# Patient Record
Sex: Male | Born: 1975 | Race: Black or African American | Hispanic: No | Marital: Single | Smoking: Current every day smoker
Health system: Southern US, Community
[De-identification: ages and names within clinical notes are randomized; demographics above are authoritative.]

---

## 2007-07-17 ENCOUNTER — Emergency Department: Payer: Self-pay | Admitting: Emergency Medicine

## 2012-09-29 ENCOUNTER — Emergency Department (HOSPITAL_BASED_OUTPATIENT_CLINIC_OR_DEPARTMENT_OTHER)
Admission: EM | Admit: 2012-09-29 | Discharge: 2012-09-29 | Disposition: A | Discharge status: Home / Self Care | Payer: Self-pay | Attending: Emergency Medicine | Admitting: Emergency Medicine

## 2012-09-29 ENCOUNTER — Encounter (HOSPITAL_BASED_OUTPATIENT_CLINIC_OR_DEPARTMENT_OTHER): Payer: Self-pay

## 2012-09-29 DIAGNOSIS — F17200 Nicotine dependence, unspecified, uncomplicated: Secondary | ICD-10-CM | POA: Insufficient documentation

## 2012-09-29 DIAGNOSIS — R599 Enlarged lymph nodes, unspecified: Secondary | ICD-10-CM | POA: Insufficient documentation

## 2012-09-29 MED ORDER — AZITHROMYCIN 250 MG PO TABS
2000.0000 mg | ORAL_TABLET | Freq: Once | ORAL | Status: AC
  Administered 2012-09-29: 2000 mg via ORAL
  Filled 2012-09-29: qty 8

## 2012-09-29 NOTE — ED Notes (Signed)
Pt c/o swelling to left groin-first noticed 2 days ago during sex-denies pain except with deep palpation

## 2012-09-29 NOTE — ED Provider Notes (Addendum)
CSN: [id number]     Arrival date & time 09/29/12  1052 History     First MD Initiated Contact with Patient 09/29/12 1132     Chief Complaint  Patient presents with  . Groin Swelling   (Consider location/radiation/quality/duration/timing/severity/associated sxs/prior Treatment) HPI Comments: The patient presents to the ER with a painful lump in his left groin. Patient reports that it started 2 days ago. He first noticed it after having sex. He has not had any penile discharge, testicular pain or swelling. Denies any lesions on his penis or groin area.   History reviewed. No pertinent past medical history. History reviewed. No pertinent past surgical history. No family history on file. History  Substance Use Topics  . Smoking status: Current Every Day Smoker  . Smokeless tobacco: Not on file  . Alcohol Use: No    Review of Systems  Constitutional: Negative for fever.  Genitourinary: Negative for penile swelling, scrotal swelling and testicular pain.    Allergies  Review of patient's allergies indicates no known allergies.  Home Medications  No current outpatient prescriptions on file. BP 122/77  Pulse 88  Temp(Src) 98.1 F (36.7 C) (Oral)  Resp 16  Ht 6\' 4"  (1.93 m)  Wt 217 lb (98.431 kg)  BMI 26.43 kg/m2  SpO2 99% Physical Exam  Constitutional: He is oriented to person, place, and time. He appears well-developed and well-nourished. No distress.  HENT:  Head: Normocephalic and atraumatic.  Right Ear: Hearing normal.  Left Ear: Hearing normal.  Nose: Nose normal.  Mouth/Throat: Oropharynx is clear and moist and mucous membranes are normal.  Eyes: Conjunctivae and EOM are normal. Pupils are equal, round, and reactive to light.  Neck: Normal range of motion. Neck supple.  Cardiovascular: Regular rhythm, S1 normal and S2 normal.  Exam reveals no gallop and no friction rub.   No murmur heard. Pulmonary/Chest: Effort normal and breath sounds normal. No respiratory  distress. He exhibits no tenderness.  Abdominal: Soft. Normal appearance and bowel sounds are normal. There is no hepatosplenomegaly. There is no tenderness. There is no rebound, no guarding, no tenderness at McBurney's point and negative Murphy's sign. No hernia. Hernia confirmed negative in the right inguinal area and confirmed negative in the left inguinal area.  Genitourinary: Testes normal and penis normal. Right testis shows no mass, no swelling and no tenderness. Left testis shows no mass, no swelling and no tenderness. Circumcised.  Musculoskeletal: Normal range of motion.  Lymphadenopathy:       Left: Inguinal (Less than 1 cm slightly tender inguinal node present) adenopathy present.  Neurological: He is alert and oriented to person, place, and time. He has normal strength. No cranial nerve deficit or sensory deficit. Coordination normal. GCS eye subscore is 4. GCS verbal subscore is 5. GCS motor subscore is 6.  Skin: Skin is warm, dry and intact. No rash noted. No cyanosis.  Psychiatric: He has a normal mood and affect. His speech is normal and behavior is normal. Thought content normal.    ED Course   Procedures (including critical care time)  Labs Reviewed - No data to display No results found.  Diagnosis: Lymphadenopathy  MDM  Patient has identified a small lump in his left groin which is clearly a lymph node. It does not seem to be enlarged or inflamed. He has minimal tenderness. There is no associated infection. Genital exam is normal and there is no urethral discharge. Likely does not need treatment, but will empirically treat with Zithromax 2 g  by mouth x1. Follow up as needed.  Gilda Crease, MD 09/29/12 1140  Gilda Crease, MD 09/29/12 1141

## 2012-12-06 ENCOUNTER — Emergency Department (HOSPITAL_BASED_OUTPATIENT_CLINIC_OR_DEPARTMENT_OTHER)
Admission: EM | Admit: 2012-12-06 | Discharge: 2012-12-06 | Discharge status: Against Medical Advice | Payer: Self-pay | Attending: Emergency Medicine | Admitting: Emergency Medicine

## 2012-12-06 ENCOUNTER — Emergency Department (HOSPITAL_BASED_OUTPATIENT_CLINIC_OR_DEPARTMENT_OTHER): Payer: Self-pay

## 2012-12-06 ENCOUNTER — Encounter (HOSPITAL_BASED_OUTPATIENT_CLINIC_OR_DEPARTMENT_OTHER): Payer: Self-pay | Admitting: Emergency Medicine

## 2012-12-06 DIAGNOSIS — M549 Dorsalgia, unspecified: Secondary | ICD-10-CM | POA: Insufficient documentation

## 2012-12-06 DIAGNOSIS — F17200 Nicotine dependence, unspecified, uncomplicated: Secondary | ICD-10-CM | POA: Insufficient documentation

## 2012-12-06 DIAGNOSIS — Z8619 Personal history of other infectious and parasitic diseases: Secondary | ICD-10-CM | POA: Insufficient documentation

## 2012-12-06 DIAGNOSIS — R300 Dysuria: Secondary | ICD-10-CM | POA: Insufficient documentation

## 2012-12-06 DIAGNOSIS — R109 Unspecified abdominal pain: Secondary | ICD-10-CM | POA: Insufficient documentation

## 2012-12-06 LAB — BASIC METABOLIC PANEL
BUN: 10 mg/dL (ref 6–23)
CO2: 30 mEq/L (ref 19–32)
Chloride: 104 mEq/L (ref 96–112)
Creatinine, Ser: 1.2 mg/dL (ref 0.50–1.35)

## 2012-12-06 LAB — CBC WITH DIFFERENTIAL/PLATELET
Basophils Relative: 0 % (ref 0–1)
HCT: 44.8 % (ref 39.0–52.0)
Hemoglobin: 14.6 g/dL (ref 13.0–17.0)
Lymphocytes Relative: 31 % (ref 12–46)
MCHC: 32.6 g/dL (ref 30.0–36.0)
Monocytes Absolute: 0.7 10*3/uL (ref 0.1–1.0)
Monocytes Relative: 7 % (ref 3–12)
Neutro Abs: 6.3 10*3/uL (ref 1.7–7.7)

## 2012-12-06 LAB — URINALYSIS, ROUTINE W REFLEX MICROSCOPIC
Glucose, UA: NEGATIVE mg/dL
Ketones, ur: NEGATIVE mg/dL
Leukocytes, UA: NEGATIVE
Protein, ur: 100 mg/dL — AB
Urobilinogen, UA: 1 mg/dL (ref 0.0–1.0)

## 2012-12-06 LAB — URINE MICROSCOPIC-ADD ON

## 2012-12-06 MED ORDER — OXYCODONE-ACETAMINOPHEN 5-325 MG PO TABS: 1.0000 | ORAL_TABLET | ORAL | Status: DC | PRN

## 2012-12-06 MED ORDER — CLINDAMYCIN HCL 300 MG PO CAPS: 300.0000 mg | ORAL_CAPSULE | Freq: Three times a day (TID) | ORAL | Status: DC

## 2012-12-06 NOTE — ED Notes (Signed)
Back pain x 2 days. Penile discharge.

## 2012-12-06 NOTE — ED Provider Notes (Signed)
CSN: [id number]     Arrival date & time 12/06/12  1231 History   First MD Initiated Contact with Patient 12/06/12 1327     Chief Complaint  Patient presents with  . Back Pain   (Consider location/radiation/quality/duration/timing/severity/associated sxs/prior Treatment) HPI Comments: 37 year old male presents with intermittent back pain for one week. He is also had dysuria during this time. He states that it does not quite burned but more stings area and he states he's had gonorrhea in the past and this is very different and milder. He has not noticed any urinary discharge. Has not had any penile or testicular pain. He has a family history of kidney stones he has never had any.   History reviewed. No pertinent past medical history. History reviewed. No pertinent past surgical history. No family history on file. History  Substance Use Topics  . Smoking status: Current Every Day Smoker -- 0.50 packs/day    Types: Cigarettes  . Smokeless tobacco: Not on file  . Alcohol Use: No    Review of Systems  Constitutional: Negative for fever and chills.  Gastrointestinal: Positive for abdominal pain (Intermittently). Negative for nausea, vomiting and diarrhea.  Genitourinary: Positive for dysuria. Negative for frequency, hematuria, flank pain, discharge, penile swelling, scrotal swelling, penile pain and testicular pain.  Musculoskeletal: Positive for back pain.  All other systems reviewed and are negative.    Allergies  Review of patient's allergies indicates no known allergies.  Home Medications   Current Outpatient Rx  Name  Route  Sig  Dispense  Refill  . clindamycin (CLEOCIN) 300 MG capsule   Oral   Take 1 capsule (300 mg total) by mouth 3 (three) times daily. X 10 days   30 capsule   0   . oxyCODONE-acetaminophen (PERCOCET) 5-325 MG per tablet   Oral   Take 1-2 tablets by mouth every 4 (four) hours as needed for pain.   20 tablet   0    BP 126/71  Pulse 84  Temp(Src)  98.5 F (36.9 C) (Oral)  Resp 16  Ht 6\' 5"  (1.956 m)  Wt 220 lb (99.791 kg)  BMI 26.08 kg/m2  SpO2 100% Physical Exam  Nursing note and vitals reviewed. Constitutional: He is oriented to person, place, and time. He appears well-developed and well-nourished. No distress.  HENT:  Head: Normocephalic and atraumatic.  Right Ear: External ear normal.  Left Ear: External ear normal.  Nose: Nose normal.  Eyes: Right eye exhibits no discharge. Left eye exhibits no discharge.  Neck: Neck supple.  Cardiovascular: Normal rate, regular rhythm, normal heart sounds and intact distal pulses.   Pulmonary/Chest: Effort normal and breath sounds normal.  Abdominal: Soft. There is no tenderness. There is no CVA tenderness.  Genitourinary: Penis normal. Right testis shows no tenderness. Left testis shows no tenderness.  Musculoskeletal:       Thoracic back: He exhibits no tenderness.       Lumbar back: He exhibits no tenderness.  Neurological: He is alert and oriented to person, place, and time. He has normal reflexes.  5/5 strength in lower extremities  Skin: Skin is warm and dry.    ED Course  Procedures (including critical care time) Labs Review Labs Reviewed  URINALYSIS, ROUTINE W REFLEX MICROSCOPIC - Abnormal; Notable for the following:    Hgb urine dipstick LARGE (*)    Protein, ur 100 (*)    All other components within normal limits  URINE MICROSCOPIC-ADD ON - Abnormal; Notable for the following:  Bacteria, UA FEW (*)    All other components within normal limits  BASIC METABOLIC PANEL - Abnormal; Notable for the following:    GFR calc non Af Amer 76 (*)    GFR calc Af Amer 88 (*)    All other components within normal limits  URINE CULTURE  CBC WITH DIFFERENTIAL   Imaging Review No results found.    MDM   1. Dysuria   2. Back pain    Patient with atypical back pain. Has normal neuro exam, no midline tenderness or systemic signs to suggest spinal cord/disc emergency. Is  well appearing. Has dysuria w/ hematuria, planned to eval for kidney stone but patient eloped prior to CT scan. He left his IV in the trash prior to leaving per the nurse.     Audree Camel, MD 12/06/12 [phone]

## 2012-12-06 NOTE — ED Notes (Signed)
Pt not found in room, CT, or any where else throughout departmetn, including lobby. Pt's gown on bed, IV in trash. MD made aware.

## 2012-12-08 LAB — URINE CULTURE
Colony Count: NO GROWTH
Culture: NO GROWTH

## 2013-06-30 ENCOUNTER — Encounter: Payer: Self-pay | Admitting: Emergency Medicine

## 2013-06-30 ENCOUNTER — Emergency Department
Admission: EM | Admit: 2013-06-30 | Discharge: 2013-06-30 | Disposition: A | Discharge status: Home / Self Care | Payer: Self-pay | Source: Home / Self Care | Attending: Emergency Medicine | Admitting: Emergency Medicine

## 2013-06-30 DIAGNOSIS — Z202 Contact with and (suspected) exposure to infections with a predominantly sexual mode of transmission: Secondary | ICD-10-CM

## 2013-06-30 DIAGNOSIS — N342 Other urethritis: Secondary | ICD-10-CM

## 2013-06-30 LAB — POCT URINALYSIS DIP (MANUAL ENTRY)
Bilirubin, UA: NEGATIVE
Blood, UA: NEGATIVE
Glucose, UA: NEGATIVE
Ketones, POC UA: NEGATIVE
Leukocytes, UA: NEGATIVE
Nitrite, UA: NEGATIVE
Spec Grav, UA: 1.025 (ref 1.005–1.03)
Urobilinogen, UA: 1 (ref 0–1)
pH, UA: 7 (ref 5–8)

## 2013-06-30 MED ORDER — DOXYCYCLINE HYCLATE 100 MG PO CAPS
100.0000 mg | ORAL_CAPSULE | Freq: Two times a day (BID) | ORAL | Status: DC
Start: 2013-06-30 — End: 2013-10-08

## 2013-06-30 NOTE — ED Notes (Signed)
Pt reports that he had unprotected sex 1 wk ago and since then his genital area "feels funny" and tingling. Denies fever, d/c or swelling.

## 2013-07-01 LAB — GC/CHLAMYDIA PROBE AMP, URINE
Chlamydia, Swab/Urine, PCR: NEGATIVE
GC Probe Amp, Urine: NEGATIVE

## 2013-07-01 LAB — RPR

## 2013-07-01 LAB — HIV ANTIBODY (ROUTINE TESTING W REFLEX): HIV 1&2 Ab, 4th Generation: NONREACTIVE

## 2013-07-02 NOTE — ED Provider Notes (Addendum)
CSN: [id number]     Arrival date & time 06/30/13  1154 History   First MD Initiated Contact with Patient 06/30/13 1247     Chief Complaint  Patient presents with  . Exposure to STD  . Groin Pain   (Consider location/radiation/quality/duration/timing/severity/associated sxs/prior Treatment) HPI Had unprotected vaginal intercourse with a woman seven days ago. Complains of tingling, and "feeling funny" in the urethra for past several days. No burning dysuria or hematuria or pyuria or discharge or swelling or any other GU symptoms. No fever or chills or general symptoms and he otherwise feels well. No rash or sores or lesions History reviewed. No pertinent past medical history. History reviewed. No pertinent past surgical history. History reviewed. No pertinent family history. History  Substance Use Topics  . Smoking status: Current Every Day Smoker -- 0.50 packs/day    Types: Cigarettes  . Smokeless tobacco: Not on file  . Alcohol Use: No    Review of Systems  All other systems reviewed and are negative.   Allergies  Review of patient's allergies indicates no known allergies.  Home Medications   Prior to Admission medications   Medication Sig Start Date End Date Taking? Authorizing Provider  doxycycline (VIBRAMYCIN) 100 MG capsule Take 1 capsule (100 mg total) by mouth 2 (two) times daily. 06/30/13   Lajean Manesavid Massey, MD   BP 130/81  Pulse 79  Temp(Src) 98.2 F (36.8 C) (Oral)  Resp 16  Ht 6\' 5"  (1.956 m)  Wt 204 lb (92.534 kg)  BMI 24.19 kg/m2  SpO2 98% Physical Exam  Nursing note and vitals reviewed. Constitutional: He is oriented to person, place, and time. He appears well-developed and well-nourished. No distress.  HENT:  Head: Normocephalic and atraumatic.  Eyes: Conjunctivae and EOM are normal. Pupils are equal, round, and reactive to light. No scleral icterus.  Neck: Normal range of motion.  Cardiovascular: Normal rate.   Pulmonary/Chest: Effort normal.   Abdominal: He exhibits no distension.  Genitourinary: Testes normal and penis normal. No penile erythema. No discharge found.  No lesions seen. No masses  Musculoskeletal: Normal range of motion.  Lymphadenopathy:       Right: No inguinal adenopathy present.       Left: No inguinal adenopathy present.  Neurological: He is alert and oriented to person, place, and time.  Skin: Skin is warm.  Psychiatric: He has a normal mood and affect.    ED Course  Procedures (including critical care time) Labs Review Labs Reviewed  GC/CHLAMYDIA PROBE AMP, URINE   Narrative:    Performed at:  [facility]                [address], [address]                [place], [place] [address]  HIV ANTIBODY (ROUTINE TESTING)   Narrative:    Performed at:  [facility]                [address], [address]                [place], [place] [address]  RPR   Narrative:    Performed at:  [facility]                [address], [address]                [place], [place] [address]  GC/CHLAMYDIA PROBE AMP, GENITAL  POCT URINALYSIS DIP (MANUAL ENTRY)  Imaging Review No results found.  Urinalysis today within normal limits MDM   1. Exposure to STD   2. Urethritis    Testing and treatment options discussed. He agrees with the following plans: Test for chlamydia, GC, HIV and RPR. Empiric coverage for chlamydia with doxycycline 100 BID times 10 days. Follow-up with your primary care doctor in 5-7 days if not improving, or sooner if symptoms become worse. Precautions discussed. Red flags discussed. Questions invited and answered.---discussed preventative measures and safe sex Patient voiced understanding and agreement.     Lajean Manesavid Massey, MD 07/02/13 1147  Lajean Manesavid Massey, MD 07/02/13 1148  Lajean Manesavid Massey, MD 07/02/13 1149

## 2013-07-03 ENCOUNTER — Telehealth: Payer: Self-pay

## 2013-07-03 ENCOUNTER — Telehealth: Payer: Self-pay | Admitting: *Deleted

## 2013-07-03 NOTE — ED Notes (Signed)
Called patient left message for patient to return call 

## 2013-07-04 ENCOUNTER — Telehealth: Payer: Self-pay | Admitting: *Deleted

## 2013-10-08 ENCOUNTER — Emergency Department (HOSPITAL_BASED_OUTPATIENT_CLINIC_OR_DEPARTMENT_OTHER)
Admission: EM | Admit: 2013-10-08 | Discharge: 2013-10-08 | Disposition: A | Discharge status: Home / Self Care | Payer: Self-pay | Attending: Emergency Medicine | Admitting: Emergency Medicine

## 2013-10-08 ENCOUNTER — Encounter (HOSPITAL_BASED_OUTPATIENT_CLINIC_OR_DEPARTMENT_OTHER): Payer: Self-pay | Admitting: Emergency Medicine

## 2013-10-08 DIAGNOSIS — F17200 Nicotine dependence, unspecified, uncomplicated: Secondary | ICD-10-CM | POA: Insufficient documentation

## 2013-10-08 DIAGNOSIS — Z202 Contact with and (suspected) exposure to infections with a predominantly sexual mode of transmission: Secondary | ICD-10-CM | POA: Insufficient documentation

## 2013-10-08 DIAGNOSIS — R350 Frequency of micturition: Secondary | ICD-10-CM | POA: Insufficient documentation

## 2013-10-08 MED ORDER — METRONIDAZOLE 500 MG PO TABS
2000.0000 mg | ORAL_TABLET | Freq: Once | ORAL | Status: AC
  Administered 2013-10-08: 2000 mg via ORAL
  Filled 2013-10-08: qty 4

## 2013-10-08 NOTE — Discharge Instructions (Signed)
Return to the ED with any concerns including, vomitng and not able to keep down liquids, abdominal pain, difficulty breathing, decreased level of alertness/lethargy, or any other alarming symptoms

## 2013-10-08 NOTE — ED Provider Notes (Signed)
CSN: [id number]     Arrival date & time 10/08/13  1015 History   First MD Initiated Contact with Patient 10/08/13 1030     Chief Complaint  Patient presents with  . Urinary Frequency     (Consider location/radiation/quality/duration/timing/severity/associated sxs/prior Treatment) HPI Pt presenting with c/o being informed that his sexual partner was diagnosed with trich.  Other tests were negative.  Pt denies any symptoms.  He mentioned to triage nurse that he had urinary frequency, but during my eval he notes that since receiving this information he has been thinking about it nonstop and feels this is why he is urinating frequently.  No penile discharge, no testicular pain.  No fever/chills, no vomiting or abdominal pain.  Discharged with strict return precautions.  Pt agreeable with plan.  History reviewed. No pertinent past medical history. History reviewed. No pertinent past surgical history. No family history on file. History  Substance Use Topics  . Smoking status: Current Every Day Smoker -- 0.50 packs/day    Types: Cigarettes  . Smokeless tobacco: Not on file  . Alcohol Use: No    Review of Systems ROS reviewed and all otherwise negative except for mentioned in HPI    Allergies  Review of patient's allergies indicates no known allergies.  Home Medications   Prior to Admission medications   Not on File   BP 129/86  Pulse 87  Temp(Src) 97.6 F (36.4 C) (Oral)  Resp 16  Ht 6\' 5"  (1.956 m)  Wt 205 lb (92.987 kg)  BMI 24.30 kg/m2  SpO2 100% Vitals reviewed Physical Exam Physical Examination: General appearance - alert, well appearing, and in no distress Mental status - alert, oriented to person, place, and time Eyes - no conjunctival injection, no scleral icterus Mouth - mucous membranes moist, pharynx normal without lesions Chest - clear to auscultation, no wheezes, rales or rhonchi, symmetric air entry Heart - normal rate, regular rhythm, normal S1, S2, no  murmurs, rubs, clicks or gallops Extremities - peripheral pulses normal, no pedal edema, no clubbing or cyanosis Skin - normal coloration and turgor, no rashes  ED Course  Procedures (including critical care time) Labs Review Labs Reviewed - No data to display  Imaging Review No results found.   EKG Interpretation None      MDM   Final diagnoses:  STD exposure    Pt presents for treatment as sexual partner was diagnosed with trich several days ago.  Pt treated with flagyl.  Discharged with strict return precautions.  Pt agreeable with plan.   Ethelda ChickMartha K Linker, MD 10/12/13 [phone]

## 2013-10-08 NOTE — ED Notes (Signed)
Patient here with urinary frequency for the past couple of days, reports that his partner informed him last week that he was diagnosed with STD. Denies discharge, denies any discomfort

## 2015-05-13 ENCOUNTER — Emergency Department (HOSPITAL_BASED_OUTPATIENT_CLINIC_OR_DEPARTMENT_OTHER)
Admission: EM | Admit: 2015-05-13 | Discharge: 2015-05-13 | Disposition: A | Discharge status: Home / Self Care | Payer: Self-pay | Attending: Emergency Medicine | Admitting: Emergency Medicine

## 2015-05-13 ENCOUNTER — Encounter (HOSPITAL_BASED_OUTPATIENT_CLINIC_OR_DEPARTMENT_OTHER): Payer: Self-pay | Admitting: Emergency Medicine

## 2015-05-13 DIAGNOSIS — H05222 Edema of left orbit: Secondary | ICD-10-CM | POA: Insufficient documentation

## 2015-05-13 DIAGNOSIS — B86 Scabies: Secondary | ICD-10-CM | POA: Insufficient documentation

## 2015-05-13 DIAGNOSIS — F17210 Nicotine dependence, cigarettes, uncomplicated: Secondary | ICD-10-CM | POA: Insufficient documentation

## 2015-05-13 DIAGNOSIS — H5789 Other specified disorders of eye and adnexa: Secondary | ICD-10-CM

## 2015-05-13 MED ORDER — PERMETHRIN 5 % EX CREA: TOPICAL_CREAM | CUTANEOUS | Status: DC

## 2015-05-13 MED ORDER — CEPHALEXIN 500 MG PO CAPS: 500.0000 mg | ORAL_CAPSULE | Freq: Two times a day (BID) | ORAL | Status: DC

## 2015-05-13 MED FILL — PERMETHRIN 5% CREAM: 5 | 7 days supply | Qty: 60 | Fill #0

## 2015-05-13 MED FILL — CEPHALEXIN 500 MG CAPSULE: 500 | 7 days supply | Qty: 14 | Fill #0

## 2015-05-13 NOTE — ED Provider Notes (Signed)
CSN: [id number]     Arrival date & time 05/13/15  [id number] History   First MD Initiated Contact with Patient 05/13/15 1018     Chief Complaint  Patient presents with  . Eye Problem     (Consider location/radiation/quality/duration/timing/severity/associated sxs/prior Treatment) HPI Patient with 2 days of left periorbital swelling and redness. Also notes to having "bug bites" in between his fingers. No fever or chills. States he stayed in a hotel Friday night prior to symptoms onset. No other bites noted. No visual change. No pain to the eye or discharge. No known trauma. Patient states he's been taking Benadryl with no relief of the swelling.  History reviewed. No pertinent past medical history. History reviewed. No pertinent past surgical history. History reviewed. No pertinent family history. Social History  Substance Use Topics  . Smoking status: Current Every Day Smoker -- 0.50 packs/day    Types: Cigarettes  . Smokeless tobacco: None  . Alcohol Use: No    Review of Systems  Constitutional: Negative for fever and chills.  Eyes: Positive for redness. Negative for photophobia, pain, discharge and visual disturbance.  Skin: Positive for color change and rash.  Neurological: Negative for headaches.  All other systems reviewed and are negative.     Allergies  Review of patient's allergies indicates no known allergies.  Home Medications   Prior to Admission medications   Medication Sig Start Date End Date Taking? Authorizing Provider  diphenhydrAMINE (BENADRYL) 25 mg capsule Take 25 mg by mouth every 6 (six) hours as needed.   Yes Historical Provider, MD  cephALEXin (KEFLEX) 500 MG capsule Take 1 capsule (500 mg total) by mouth 2 (two) times daily. 05/13/15   Loren Raceravid Terre Hanneman, MD  permethrin (ELIMITE) 5 % cream Apply to entire body 05/13/15   Loren Raceravid Melana Hingle, MD   BP 115/80 mmHg  Pulse 80  Temp(Src) 98.1 F (36.7 C) (Oral)  Resp 20  Ht 6\' 4"  (1.93 m)  Wt 210 lb (95.255 kg)   BMI 25.57 kg/m2  SpO2 100% Physical Exam  Constitutional: He is oriented to person, place, and time. He appears well-developed and well-nourished. No distress.  HENT:  Head: Normocephalic and atraumatic.  Mouth/Throat: Oropharynx is clear and moist. No oropharyngeal exudate.  Patient has left-sided periorbital swelling and erythema. There is no induration. Patient has full range of motion of the eye without pain. There is mild conjunctival injection. No discharge noted. No hyphema or pupillary irregularity. No foreign bodies  Eyes: EOM are normal. Pupils are equal, round, and reactive to light. Right eye exhibits no discharge. Left eye exhibits no discharge.  Neck: Normal range of motion. Neck supple.  Cardiovascular: Normal rate and regular rhythm.   Pulmonary/Chest: Effort normal and breath sounds normal.  Abdominal: Soft. Bowel sounds are normal.  Musculoskeletal: Normal range of motion. He exhibits no edema or tenderness.  Lymphadenopathy:    He has no cervical adenopathy.  Neurological: He is alert and oriented to person, place, and time.  Skin: Skin is warm and dry. Rash noted. No erythema.  Patient has multiple small erythematous papules located in the interdigital spaces of both hands. No tracking.  Psychiatric: He has a normal mood and affect. His behavior is normal.  Nursing note and vitals reviewed.   ED Course  Procedures (including critical care time) Labs Review Labs Reviewed - No data to display  Imaging Review No results found. I have personally reviewed and evaluated these images and lab results as part of my medical decision-making.  EKG Interpretation None      MDM   Final diagnoses:  Periorbital swelling  Scabies    Suspect possible bedbugs versus scabies. This does not explain left periorbital swelling. There is no obvious bites in the area. Concern for periorbital cellulitis versus allergic reaction. Patient does not have orbital cellulitis by exam.  Patient has been taking Benadryl with little relief. We'll try a short course of Keflex. Also give permethrin cream. Return precautions given.    Loren Racer, MD 05/13/15 1050

## 2015-05-13 NOTE — Discharge Instructions (Signed)
Scabies, Adult Scabies is a skin condition that happens when very small insects get under the skin (infestation). This causes a rash and severe itchiness. Scabies can spread from person to person (is contagious). If you get scabies, it is common for others in your household to get scabies too. With proper treatment, symptoms usually go away in 2-4 weeks. Scabies usually does not cause lasting problems. CAUSES This condition is caused by mites (Sarcoptes scabiei, or human itch mites) that can only be seen with a microscope. The mites get into the top layer of skin and lay eggs. Scabies can spread from person to person through:  Close contact with a person who has scabies.  Contact with infested items, such as towels, bedding, or clothing. RISK FACTORS This condition is more likely to develop in:  People who live in nursing homes and other extended-care facilities.  People who have sexual contact with a partner who has scabies.  Young children who attend child care facilities.  People who care for others who are at increased risk for scabies. SYMPTOMS Symptoms of this condition may include:  Severe itchiness. This is often worse at night.  A rash that includes tiny red bumps or blisters. The rash commonly occurs on the wrist, elbow, armpit, fingers, waist, groin, or buttocks. Bumps may form a line (burrow) in some areas.  Skin irritation. This can include scaly patches or sores. DIAGNOSIS This condition is diagnosed with a physical exam. Your health care provider will look closely at your skin. In some cases, your health care provider may take a sample of your affected skin (skin scraping) and have it examined under a microscope. TREATMENT This condition may be treated with:  Medicated cream or lotion that kills the mites. This is spread on the entire body and left on for several hours. Usually, one treatment with medicated cream or lotion is enough to kill all of the mites. In severe  cases, the treatment may be repeated.  Medicated cream that relieves itching.  Medicines that help to relieve itching.  Medicines that kill the mites. This treatment is rarely used. HOME CARE INSTRUCTIONS Medicines  Take or apply over-the-counter and prescription medicines as told by your health care provider.  Apply medicated cream or lotion as told by your health care provider.  Do not wash off the medicated cream or lotion until the necessary amount of time has passed. Skin Care  Avoid scratching your affected skin.  Keep your fingernails closely trimmed to reduce injury from scratching.  Take cool baths or apply cool washcloths to help reduce itching. General Instructions  Clean all items that you recently had contact with, including bedding, clothing, and furniture. Do this on the same day that your treatment starts.  Use hot water when you wash items.  Place unwashable items into closed, airtight plastic bags for at least 3 days. The mites cannot live for more than 3 days away from human skin.  Vacuum furniture and mattresses that you use.  Make sure that other people who may have been infested are examined by a health care provider. These include members of your household and anyone who may have had contact with infested items.  Keep all follow-up visits as told by your health care provider. This is important. SEEK MEDICAL CARE IF:  You have itching that does not go away after 4 weeks of treatment.  You continue to develop new bumps or burrows.  You have redness, swelling, or pain in your rash area  after treatment.  You have fluid, blood, or pus coming from your rash.   This information is not intended to replace advice given to you by your health care provider. Make sure you discuss any questions you have with your health care provider.   Document Released: 10/31/2014 Document Reviewed: 09/11/2014 Elsevier Interactive Patient Education 2016 Elsevier  Inc. Cellulitis Cellulitis is an infection of the skin and the tissue beneath it. The infected area is usually red and tender. Cellulitis occurs most often in the arms and lower legs.  CAUSES  Cellulitis is caused by bacteria that enter the skin through cracks or cuts in the skin. The most common types of bacteria that cause cellulitis are staphylococci and streptococci. SIGNS AND SYMPTOMS   Redness and warmth.  Swelling.  Tenderness or pain.  Fever. DIAGNOSIS  Your health care provider can usually determine what is wrong based on a physical exam. Blood tests may also be done. TREATMENT  Treatment usually involves taking an antibiotic medicine. HOME CARE INSTRUCTIONS   Take your antibiotic medicine as directed by your health care provider. Finish the antibiotic even if you start to feel better.  Keep the infected arm or leg elevated to reduce swelling.  Apply a warm cloth to the affected area up to 4 times per day to relieve pain.  Take medicines only as directed by your health care provider.  Keep all follow-up visits as directed by your health care provider. SEEK MEDICAL CARE IF:   You notice red streaks coming from the infected area.  Your red area gets larger or turns dark in color.  Your bone or joint underneath the infected area becomes painful after the skin has healed.  Your infection returns in the same area or another area.  You notice a swollen bump in the infected area.  You develop new symptoms.  You have a fever. SEEK IMMEDIATE MEDICAL CARE IF:   You feel very sleepy.  You develop vomiting or diarrhea.  You have a general ill feeling (malaise) with muscle aches and pains.   This information is not intended to replace advice given to you by your health care provider. Make sure you discuss any questions you have with your health care provider.   Document Released: 11/19/2004 Document Revised: 10/31/2014 Document Reviewed: 04/27/2011 Elsevier  Interactive Patient Education [employer]2016 Elsevier Inc.

## 2015-05-13 NOTE — ED Notes (Signed)
Pt reports that he stayed at hotel Friday night and thinks he was bit by a bedbug on left eye causing swelling to left eye

## 2015-07-02 ENCOUNTER — Emergency Department (HOSPITAL_BASED_OUTPATIENT_CLINIC_OR_DEPARTMENT_OTHER): Payer: Self-pay

## 2015-07-02 ENCOUNTER — Emergency Department (HOSPITAL_BASED_OUTPATIENT_CLINIC_OR_DEPARTMENT_OTHER)
Admission: EM | Admit: 2015-07-02 | Discharge: 2015-07-02 | Disposition: A | Discharge status: Home / Self Care | Payer: Self-pay | Attending: Emergency Medicine | Admitting: Emergency Medicine

## 2015-07-02 ENCOUNTER — Encounter (HOSPITAL_BASED_OUTPATIENT_CLINIC_OR_DEPARTMENT_OTHER): Payer: Self-pay | Admitting: *Deleted

## 2015-07-02 DIAGNOSIS — F17210 Nicotine dependence, cigarettes, uncomplicated: Secondary | ICD-10-CM | POA: Insufficient documentation

## 2015-07-02 DIAGNOSIS — R1030 Lower abdominal pain, unspecified: Secondary | ICD-10-CM

## 2015-07-02 DIAGNOSIS — K5900 Constipation, unspecified: Secondary | ICD-10-CM | POA: Insufficient documentation

## 2015-07-02 LAB — COMPREHENSIVE METABOLIC PANEL
ALT: 23 U/L (ref 17–63)
AST: 21 U/L (ref 15–41)
Albumin: 3.7 g/dL (ref 3.5–5.0)
Alkaline Phosphatase: 44 U/L (ref 38–126)
Anion gap: 2 — ABNORMAL LOW (ref 5–15)
BILIRUBIN TOTAL: 0.8 mg/dL (ref 0.3–1.2)
BUN: 10 mg/dL (ref 6–20)
CO2: 31 mmol/L (ref 22–32)
CREATININE: 0.96 mg/dL (ref 0.61–1.24)
Calcium: 8.6 mg/dL — ABNORMAL LOW (ref 8.9–10.3)
Chloride: 106 mmol/L (ref 101–111)
Glucose, Bld: 92 mg/dL (ref 65–99)
POTASSIUM: 3.9 mmol/L (ref 3.5–5.1)
Sodium: 139 mmol/L (ref 135–145)
TOTAL PROTEIN: 6.4 g/dL — AB (ref 6.5–8.1)

## 2015-07-02 LAB — URINALYSIS, ROUTINE W REFLEX MICROSCOPIC
BILIRUBIN URINE: NEGATIVE
Glucose, UA: NEGATIVE mg/dL
HGB URINE DIPSTICK: NEGATIVE
KETONES UR: NEGATIVE mg/dL
Leukocytes, UA: NEGATIVE
NITRITE: NEGATIVE
Protein, ur: NEGATIVE mg/dL
SPECIFIC GRAVITY, URINE: 1.009 (ref 1.005–1.030)
pH: 7 (ref 5.0–8.0)

## 2015-07-02 LAB — CBC WITH DIFFERENTIAL/PLATELET
Basophils Absolute: 0 10*3/uL (ref 0.0–0.1)
Basophils Relative: 0 %
EOS ABS: 0.3 10*3/uL (ref 0.0–0.7)
EOS PCT: 3 %
HCT: 40.7 % (ref 39.0–52.0)
Hemoglobin: 13 g/dL (ref 13.0–17.0)
LYMPHS PCT: 29 %
Lymphs Abs: 2.9 10*3/uL (ref 0.7–4.0)
MCH: 29.1 pg (ref 26.0–34.0)
MCHC: 31.9 g/dL (ref 30.0–36.0)
MCV: 91.1 fL (ref 78.0–100.0)
Monocytes Absolute: 0.6 10*3/uL (ref 0.1–1.0)
Monocytes Relative: 6 %
NEUTROS PCT: 62 %
Neutro Abs: 6.1 10*3/uL (ref 1.7–7.7)
Platelets: 214 10*3/uL (ref 150–400)
RBC: 4.47 MIL/uL (ref 4.22–5.81)
RDW: 11.7 % (ref 11.5–15.5)
WBC: 10 10*3/uL (ref 4.0–10.5)

## 2015-07-02 LAB — PREGNANCY, URINE: PREG TEST UR: NEGATIVE

## 2015-07-02 LAB — LIPASE, BLOOD: LIPASE: 26 U/L (ref 11–51)

## 2015-07-02 MED ORDER — SODIUM CHLORIDE 0.9 % IV SOLN: INTRAVENOUS | Status: DC

## 2015-07-02 MED ORDER — POLYETHYLENE GLYCOL 3350 17 G PO PACK: 17.0000 g | PACK | Freq: Every day | ORAL | Status: DC

## 2015-07-02 MED ORDER — IOPAMIDOL (ISOVUE-300) INJECTION 61%
100.0000 mL | Freq: Once | INTRAVENOUS | Status: AC | PRN
  Administered 2015-07-02: 100 mL via INTRAVENOUS

## 2015-07-02 MED ORDER — ONDANSETRON HCL 4 MG/2ML IJ SOLN
4.0000 mg | Freq: Once | INTRAMUSCULAR | Status: DC
  Filled 2015-07-02: qty 2

## 2015-07-02 MED ORDER — SODIUM CHLORIDE 0.9 % IV BOLUS (SEPSIS)
1000.0000 mL | Freq: Once | INTRAVENOUS | Status: AC
  Administered 2015-07-02: 1000 mL via INTRAVENOUS

## 2015-07-02 NOTE — Discharge Instructions (Signed)
Workup for the abdominal pain without any acute findings other than some evidence of constipation. No other significant findings. Take the Miralax  as directed. Prescription provided. Also we discussed the finding of the calcified area on your S1 nerve root MRI is recommended to evaluate this further. Is not responsible for the symptoms that brought you in today.

## 2015-07-02 NOTE — ED Notes (Signed)
Abdominal pain x 2 days. States drinking cranberry juice made his pain lessen.

## 2015-07-02 NOTE — ED Notes (Signed)
MD at bedside. 

## 2015-07-02 NOTE — ED Notes (Signed)
Pt d/c home with rx x 1 for miralax. Ambulatory with steady gait to d/c window

## 2015-07-02 NOTE — ED Provider Notes (Signed)
CSN: [id number]     Arrival date & time 07/02/15  1222 History   First MD Initiated Contact with Patient 07/02/15 1240     Chief Complaint  Patient presents with  . Abdominal Pain     (Consider location/radiation/quality/duration/timing/severity/associated sxs/prior Treatment) Patient is a 40 y.o. male presenting with abdominal pain. The history is provided by the patient.  Abdominal Pain Associated symptoms: no chest pain, no constipation, no dysuria, no fever and no shortness of breath   Patient with history of lower quadrant abdominal pain bilateral for 2 days. Seems to improve somewhat drinking cranberry juice. But no dysuria no fevers no shortness of breath no nausea vomiting or diarrhea. No back pain. Patient states she's had similar pain in the past but has never been evaluated.  History reviewed. No pertinent past medical history. History reviewed. No pertinent past surgical history. No family history on file. Social History  Substance Use Topics  . Smoking status: Current Every Day Smoker -- 0.50 packs/day    Types: Cigarettes  . Smokeless tobacco: None  . Alcohol Use: No    Review of Systems  Constitutional: Negative for fever.  HENT: Negative for congestion.   Eyes: Negative for visual disturbance.  Respiratory: Negative for shortness of breath.   Cardiovascular: Negative for chest pain.  Gastrointestinal: Positive for abdominal pain. Negative for constipation.  Genitourinary: Negative for dysuria and difficulty urinating.  Musculoskeletal: Negative for back pain.  Skin: Negative for rash.  Neurological: Negative for weakness and numbness.  Psychiatric/Behavioral: Negative for confusion.      Allergies  Review of patient's allergies indicates no known allergies.  Home Medications   Prior to Admission medications   Medication Sig Start Date End Date Taking? Authorizing Provider  cephALEXin (KEFLEX) 500 MG capsule Take 1 capsule (500 mg total) by mouth 2 (two)  times daily. 05/13/15   Loren Raceravid Yelverton, MD  diphenhydrAMINE (BENADRYL) 25 mg capsule Take 25 mg by mouth every 6 (six) hours as needed.    Historical Provider, MD  permethrin (ELIMITE) 5 % cream Apply to entire body 05/13/15   Loren Raceravid Yelverton, MD  polyethylene glycol [facility](MIRALAX / Ethelene HalGLYCOLAX) packet Take 17 g by mouth daily. 07/02/15   Vanetta MuldersScott Sten Dematteo, MD   BP 115/77 mmHg  Pulse 62  Temp(Src) 98.5 F (36.9 C) (Oral)  Resp 16  Ht 6\' 5"  (1.956 m)  Wt 95.255 kg  BMI 24.90 kg/m2  SpO2 100% Physical Exam  Constitutional: He is oriented to person, place, and time. He appears well-developed and well-nourished. No distress.  HENT:  Head: Normocephalic and atraumatic.  Mouth/Throat: Oropharynx is clear and moist.  Eyes: Conjunctivae and EOM are normal. Pupils are equal, round, and reactive to light.  Neck: Normal range of motion. Neck supple.  Cardiovascular: Normal rate, regular rhythm and normal heart sounds.   No murmur heard. Pulmonary/Chest: Effort normal and breath sounds normal. No respiratory distress.  Abdominal: Soft. Bowel sounds are normal. There is no tenderness.  Musculoskeletal: Normal range of motion. He exhibits no edema.  Neurological: He is alert and oriented to person, place, and time. No cranial nerve deficit. He exhibits normal muscle tone. Coordination normal.  Skin: Skin is warm. No rash noted.  Nursing note and vitals reviewed.   ED Course  Procedures (including critical care time) Labs Review Labs Reviewed  COMPREHENSIVE METABOLIC PANEL - Abnormal; Notable for the following:    Calcium 8.6 (*)    Total Protein 6.4 (*)    Anion gap 2 (*)  All other components within normal limits  URINALYSIS, ROUTINE W REFLEX MICROSCOPIC (NOT AT [facility])  PREGNANCY, URINE  LIPASE, BLOOD  CBC WITH DIFFERENTIAL/PLATELET    Imaging Review Ct Abdomen Pelvis W Contrast  07/02/2015  CLINICAL DATA:  Abdominal pain for 2 days. EXAM: CT ABDOMEN AND PELVIS WITH CONTRAST TECHNIQUE:  Multidetector CT imaging of the abdomen and pelvis was performed using the standard protocol following bolus administration of intravenous contrast. CONTRAST:  ISOVUE-300 IOPAMIDOL (ISOVUE-300) INJECTION 61% COMPARISON:  None. FINDINGS: Lower chest: Small amount of inferior pericardial fluid, likely incidental. Hepatobiliary: Unremarkable Pancreas: Unremarkable Spleen: Unremarkable Adrenals/Urinary Tract: Borderline urinary bladder wall thickening for the degree of distention. Stomach/Bowel: Prominent stool throughout the colon favors constipation. The appendix is not positively identified but I do not see a definite inflammatory process along the right lower quadrant. Vascular/Lymphatic: Unremarkable Reproductive: Unremarkable Other: There is a 6 mm calcification along the anterior S1 nerve, image 66/4, with a more punctate 1 mm calcification along the left S2 nerve, image 62/4. Musculoskeletal: Degenerative spurring of both femoral heads. IMPRESSION: 1.  Prominent stool throughout the colon favors constipation. 2. Note is made of a calcification in the right S1 spinal nerve or nerve sheath near the anterior foramen, smaller calcification on the left, etiology uncertain. I can't exclude a calcified schwannoma or remote inflammatory condition with dystrophic calcification. Spinal nerve calcification can be associated with Charcot-Marie-Tooth disease. Correlate with any lower extremity neurologic symptoms in determining the knee for further workup for example by pelvic MRI. Electronically Signed   By: Gaylyn Rong M.D.   On: 07/02/2015 14:43   I have personally reviewed and evaluated these images and lab results as part of my medical decision-making.   EKG Interpretation None      MDM   Final diagnoses:  Lower abdominal pain  Constipation, unspecified constipation type    Workup for the lower quadrant abdominal pain without any significant findings other than evidence of some constipation.  Patient also counseled on the calcification at the right S1 nerve root. He will arrange for follow-up. Meantime we'll treat with Mira lax. Patient nontoxic no acute distress. Labs without any significant findings.    Vanetta Mulders, MD 07/02/15 1534

## 2016-03-27 ENCOUNTER — Encounter (HOSPITAL_BASED_OUTPATIENT_CLINIC_OR_DEPARTMENT_OTHER): Payer: Self-pay | Admitting: Emergency Medicine

## 2016-03-27 ENCOUNTER — Emergency Department (HOSPITAL_BASED_OUTPATIENT_CLINIC_OR_DEPARTMENT_OTHER)
Admission: EM | Admit: 2016-03-27 | Discharge: 2016-03-27 | Disposition: A | Discharge status: Home / Self Care | Payer: Self-pay | Attending: Emergency Medicine | Admitting: Emergency Medicine

## 2016-03-27 DIAGNOSIS — K047 Periapical abscess without sinus: Secondary | ICD-10-CM | POA: Insufficient documentation

## 2016-03-27 DIAGNOSIS — F17210 Nicotine dependence, cigarettes, uncomplicated: Secondary | ICD-10-CM | POA: Insufficient documentation

## 2016-03-27 MED ORDER — NAPROXEN 500 MG PO TABS: 500.0000 mg | ORAL_TABLET | Freq: Two times a day (BID) | ORAL | 0 refills | Status: DC

## 2016-03-27 MED ORDER — PENICILLIN V POTASSIUM 500 MG PO TABS: 500.0000 mg | ORAL_TABLET | Freq: Four times a day (QID) | ORAL | 0 refills | Status: DC

## 2016-03-27 MED ORDER — HYDROCODONE-ACETAMINOPHEN 5-325 MG PO TABS: 1.0000 | ORAL_TABLET | Freq: Four times a day (QID) | ORAL | 0 refills | Status: DC | PRN

## 2016-03-27 MED FILL — PENICILLIN VK 500 MG TABLET: 500 | 10 days supply | Qty: 40 | Fill #0

## 2016-03-27 MED FILL — HYDROCODON-APAP 5-325: 5-325 | 2 days supply | Qty: 10 | Fill #0

## 2016-03-27 MED FILL — NAPROXEN 500 MG TABLET: 500 | 7 days supply | Qty: 14 | Fill #0

## 2016-03-27 NOTE — Discharge Instructions (Signed)
Take antibiotic as directed starting today. Take the Naprosyn on a regular basis. Supplement with hydrocodone as needed for additional pain relief. Keep your appointment with your dentist. Return for any swelling to the floor the mouth.

## 2016-03-27 NOTE — ED Triage Notes (Signed)
Right sided dental pain since Sunday. Facial swelling noted.

## 2016-03-27 NOTE — ED Provider Notes (Signed)
[facility]   CSN: [id number] Arrival date & time: 03/27/16  4098     History   Chief Complaint Chief Complaint  Patient presents with  . Dental Pain    HPI Maurice Jensen is a 41 y.o. male.   Right lower molar since Sunday. Got worse in the last few days with swelling to the right lower cheek area. Patient has made an appointment to follow-up with his dentist. Patient without any difficulty swallowing. No change in voice. Denies any swelling to the tongue or floor the mouth.      No past medical history on file.  There are no active problems to display for this patient.   No past surgical history on file.     Home Medications    Prior to Admission medications   Medication Sig Start Date End Date Taking? Authorizing Provider  HYDROcodone-acetaminophen (NORCO/VICODIN) 5-325 MG tablet Take 1-2 tablets by mouth every 6 (six) hours as needed for moderate pain. 03/27/16   Vanetta Mulders, MD  naproxen (NAPROSYN) 500 MG tablet Take 1 tablet (500 mg total) by mouth 2 (two) times daily. 03/27/16   Vanetta Mulders, MD  penicillin v potassium (VEETID) 500 MG tablet Take 1 tablet (500 mg total) by mouth 4 (four) times daily. 03/27/16   Vanetta Mulders, MD    Family History No family history on file.  Social History Social History  Substance Use Topics  . Smoking status: Current Every Day Smoker    Packs/day: 0.50    Types: Cigarettes  . Smokeless tobacco: Never Used  . Alcohol use No     Allergies   Patient has no known allergies.   Review of Systems Review of Systems  Constitutional: Negative for fever.  HENT: Positive for dental problem. Negative for drooling, trouble swallowing and voice change.   Eyes: Negative for visual disturbance.  Respiratory: Negative for shortness of breath.   Cardiovascular: Negative for chest pain.  Gastrointestinal: Negative for abdominal pain.  Skin: Negative for rash.  Neurological: Negative for headaches.    Hematological: Does not bruise/bleed easily.  Psychiatric/Behavioral: Negative for confusion.     Physical Exam Updated Vital Signs BP 126/100 (BP Location: Left Arm)   Pulse 66   Temp 98.3 F (36.8 C) (Oral)   Resp 18   Ht 6\' 5"  (1.956 m)   Wt 95.3 kg   SpO2 100%   BMI 24.90 kg/m   Physical Exam  Constitutional: He is oriented to person, place, and time. He appears well-developed and well-nourished. No distress.  HENT:  Head: Normocephalic and atraumatic.  Mouth/Throat: Oropharynx is clear and moist.  Swelling to the right lower cheek area. Induration tenderness no fluctuance. Most likely secondary to right lower first molar. No swelling to the floor the mouth.  Eyes: EOM are normal. Pupils are equal, round, and reactive to light.  Neck: Normal range of motion. Neck supple.  Cardiovascular: Normal rate, regular rhythm and normal heart sounds.   Pulmonary/Chest: Effort normal and breath sounds normal. No respiratory distress.  Abdominal: Soft. Bowel sounds are normal. There is no tenderness.  Musculoskeletal: Normal range of motion.  Lymphadenopathy:    He has no cervical adenopathy.  Neurological: He is alert and oriented to person, place, and time. No cranial nerve deficit.  Skin: Skin is warm. No rash noted.  Nursing note and vitals reviewed.    ED Treatments / Results  Labs (all labs ordered are listed, but only abnormal results are displayed) Labs Reviewed -  No data to display  EKG  EKG Interpretation None       Radiology No results found.  Procedures Procedures (including critical care time)  Medications Ordered in ED Medications - No data to display   Initial Impression / Assessment and Plan / ED Course  I have reviewed the triage vital signs and the nursing notes.  Pertinent labs & imaging results that were available during my care of the patient were reviewed by me and considered in my medical decision making (see chart for details).      Patient with right lower molar with abscess clinically. No swelling to the floor the mouth. We treated with antibiotics anti-inflammatories pain medicine patient RE has follow-up with Dentist arrange.  Final Clinical Impressions(s) / ED Diagnoses   Final diagnoses:  Dental abscess    New Prescriptions New Prescriptions   HYDROCODONE-ACETAMINOPHEN (NORCO/VICODIN) 5-325 MG TABLET    Take 1-2 tablets by mouth every 6 (six) hours as needed for moderate pain.   NAPROXEN (NAPROSYN) 500 MG TABLET    Take 1 tablet (500 mg total) by mouth 2 (two) times daily.   PENICILLIN V POTASSIUM (VEETID) 500 MG TABLET    Take 1 tablet (500 mg total) by mouth 4 (four) times daily.     Vanetta MuldersScott Lynell Greenhouse, MD 03/27/16 [phone]

## 2016-07-06 ENCOUNTER — Encounter (HOSPITAL_BASED_OUTPATIENT_CLINIC_OR_DEPARTMENT_OTHER): Payer: Self-pay | Admitting: Emergency Medicine

## 2016-07-06 ENCOUNTER — Emergency Department (HOSPITAL_BASED_OUTPATIENT_CLINIC_OR_DEPARTMENT_OTHER)
Admission: EM | Admit: 2016-07-06 | Discharge: 2016-07-06 | Disposition: A | Discharge status: Home / Self Care | Payer: Self-pay | Attending: Emergency Medicine | Admitting: Emergency Medicine

## 2016-07-06 DIAGNOSIS — F17290 Nicotine dependence, other tobacco product, uncomplicated: Secondary | ICD-10-CM | POA: Insufficient documentation

## 2016-07-06 DIAGNOSIS — K047 Periapical abscess without sinus: Secondary | ICD-10-CM | POA: Insufficient documentation

## 2016-07-06 MED ORDER — HYDROCODONE-ACETAMINOPHEN 5-325 MG PO TABS: 1.0000 | ORAL_TABLET | Freq: Four times a day (QID) | ORAL | 0 refills | Status: DC | PRN

## 2016-07-06 MED ORDER — PENICILLIN V POTASSIUM 500 MG PO TABS: 500.0000 mg | ORAL_TABLET | Freq: Three times a day (TID) | ORAL | 0 refills | Status: DC

## 2016-07-06 MED FILL — HYDROCODON-APAP 5-325: 5-325 | 2 days supply | Qty: 12 | Fill #0

## 2016-07-06 MED FILL — PENICILLIN VK 500 MG TABLET: 500 | 10 days supply | Qty: 30 | Fill #0

## 2016-07-06 NOTE — ED Provider Notes (Signed)
  [facility]   CSN: [id number] Arrival date & time: 07/06/16  0909     History   Chief Complaint Chief Complaint  Patient presents with  . Dental Problem    HPI Maurice Jensen is a 41 y.o. male.  Patient is a 41 year old male who presents with complaints of dental pain and facial swelling. This started 2 days ago and is worsening. He denies any fevers or chills. He denies any difficulty eating or swallowing.   The history is provided by the patient.    History reviewed. No pertinent past medical history.  There are no active problems to display for this patient.   History reviewed. No pertinent surgical history.     Home Medications    Prior to Admission medications   Medication Sig Start Date End Date Taking? Authorizing Provider  HYDROcodone-acetaminophen (NORCO/VICODIN) 5-325 MG tablet Take 1-2 tablets by mouth every 6 (six) hours as needed for moderate pain. 03/27/16   Vanetta MuldersZackowski, Scott, MD  naproxen (NAPROSYN) 500 MG tablet Take 1 tablet (500 mg total) by mouth 2 (two) times daily. 03/27/16   Vanetta MuldersZackowski, Scott, MD  penicillin v potassium (VEETID) 500 MG tablet Take 1 tablet (500 mg total) by mouth 4 (four) times daily. 03/27/16   Vanetta MuldersZackowski, Scott, MD    Family History History reviewed. No pertinent family history.  Social History Social History  Substance Use Topics  . Smoking status: Current Every Day Smoker    Types: Cigars  . Smokeless tobacco: Never Used  . Alcohol use No     Allergies   Patient has no known allergies.   Review of Systems Review of Systems  All other systems reviewed and are negative.    Physical Exam Updated Vital Signs BP 115/80 (BP Location: Right Arm)   Pulse 81   Temp 98.2 F (36.8 C) (Oral)   Resp 18   Ht 6\' 5"  (1.956 m)   Wt 200 lb (90.7 kg)   SpO2 100%   BMI 23.72 kg/m   Physical Exam  Constitutional: He is oriented to person, place, and time. He appears well-developed and  well-nourished. No distress.  HENT:  Head: Normocephalic and atraumatic.  Mouth/Throat: Oropharynx is clear and moist.  There is swelling to the right lower mandible. There is poor dentition throughout with gingival inflammation.  Neck: Normal range of motion. Neck supple.  Neurological: He is alert and oriented to person, place, and time.  Skin: Skin is warm and dry. He is not diaphoretic.  Nursing note and vitals reviewed.    ED Treatments / Results  Labs (all labs ordered are listed, but only abnormal results are displayed) Labs Reviewed - No data to display  EKG  EKG Interpretation None       Radiology No results found.  Procedures Procedures (including critical care time)  Medications Ordered in ED Medications - No data to display   Initial Impression / Assessment and Plan / ED Course  I have reviewed the triage vital signs and the nursing notes.  Pertinent labs & imaging results that were available during my care of the patient were reviewed by me and considered in my medical decision making (see chart for details).  Dental pain, will treat With penicillin, pain medicine, and follow-up with dentistry.  Final Clinical Impressions(s) / ED Diagnoses   Final diagnoses:  None    New Prescriptions New Prescriptions   No medications on file     Geoffery Lyonselo, Caine Barfield, MD 07/06/16 [phone]

## 2016-07-06 NOTE — ED Triage Notes (Signed)
Patient states that he started to have pain to his right side lower jaw 2 days ago, now has swelling.

## 2016-07-06 NOTE — Discharge Instructions (Signed)
Penicillin as prescribed.  Hydrocodone as prescribed as needed for pain.  Follow-up with your dentist in 2 weeks as scheduled.

## 2017-10-21 IMAGING — CT CT ABD-PELV W/ CM
2 of 4 series · 16 of 46 positions shown, 18 images · IV contrast (APPLIED)
Comparison: None.

CLINICAL DATA: Abdominal pain for 2 days.

EXAM:
CT ABDOMEN AND PELVIS WITH CONTRAST
TECHNIQUE: Multidetector CT imaging of the abdomen and pelvis was performed
using the standard protocol following bolus administration of
intravenous contrast.
CONTRAST:  100mL K5BJBF-H44 IOPAMIDOL (K5BJBF-H44) INJECTION 61%

[Series 3: coronal st · coronal · 0.75mm/px · 3 of 80 slices shown]
[im 27/80  soft-tissue]
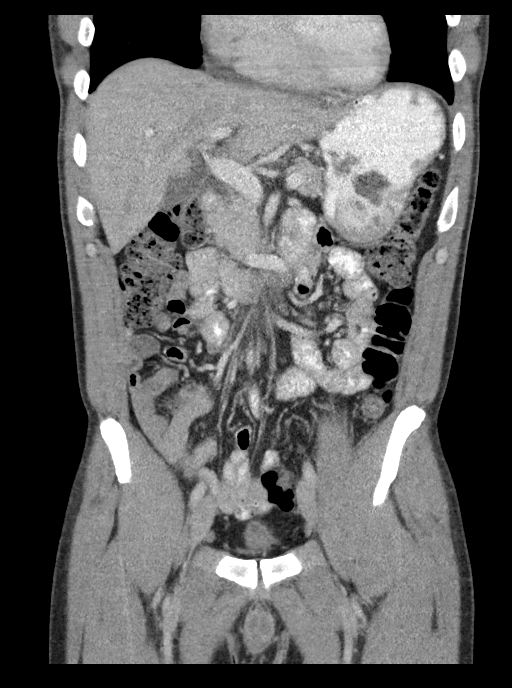
[im 36/80  soft-tissue]
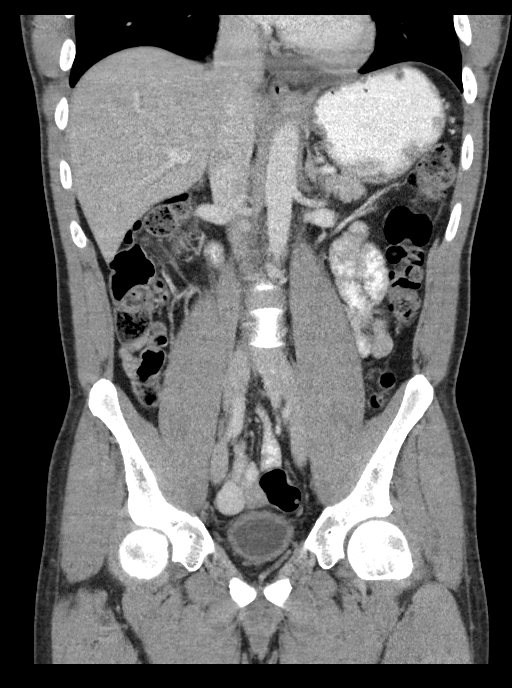
[im 44/80  soft-tissue]
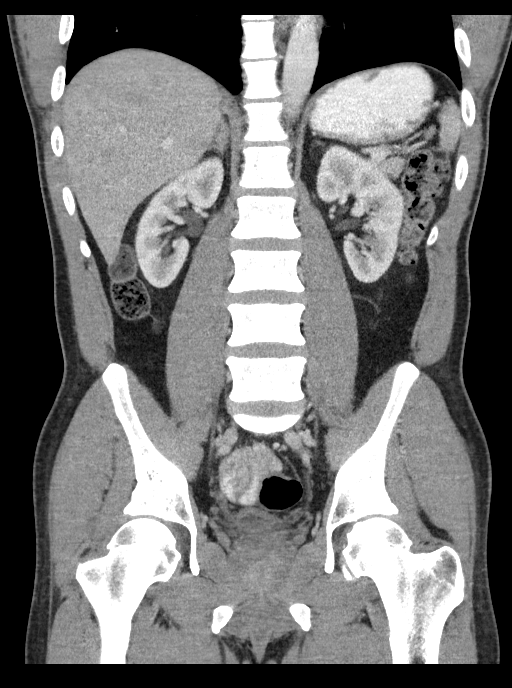

[Series 4: axial st · axial · 0.68mm/px · z∈[-599,-149]mm · 13 of 98 slices shown, 15 images]
[im 4/98  soft-tissue]
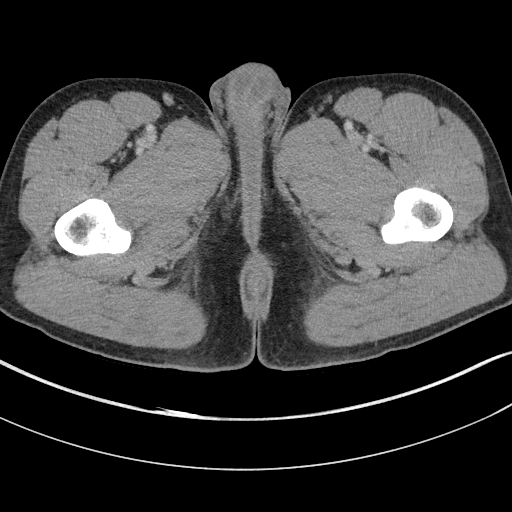
[im 4/98  bone]
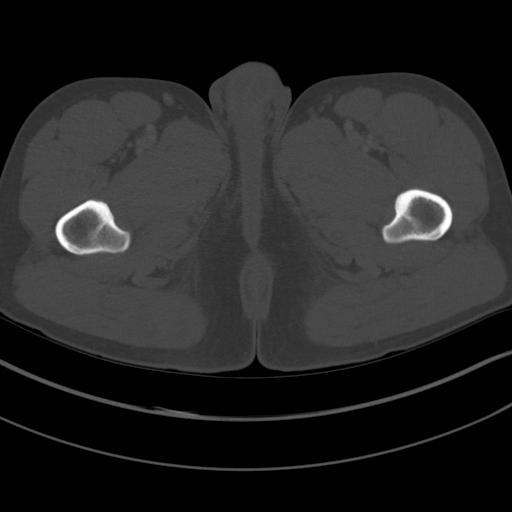
[im 12/98  soft-tissue]
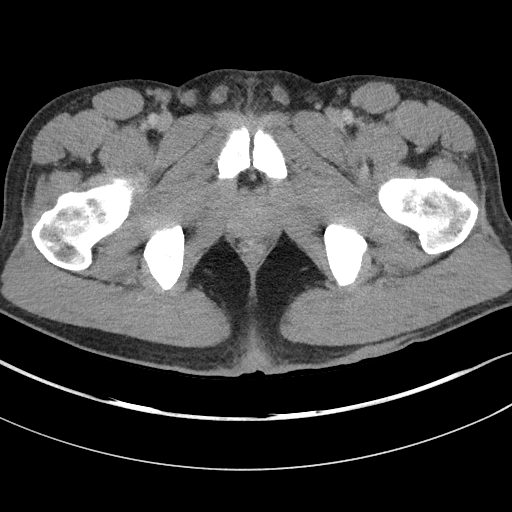
[im 19/98  soft-tissue]
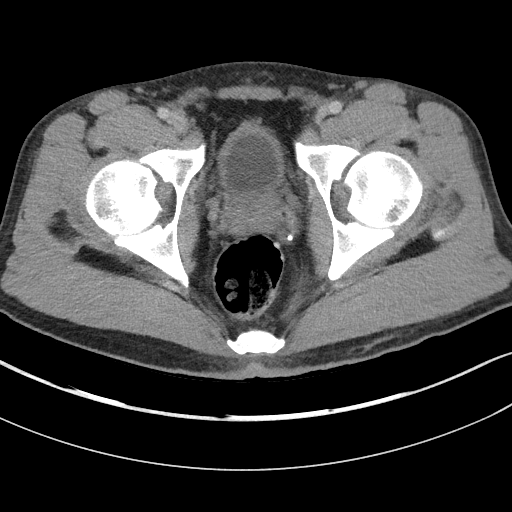
[im 27/98  soft-tissue]
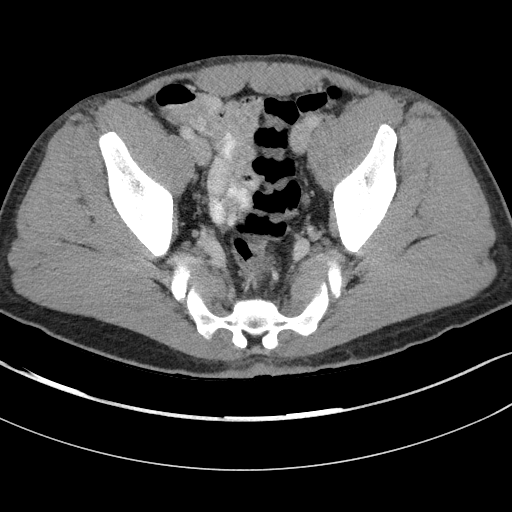
[im 34/98  soft-tissue]
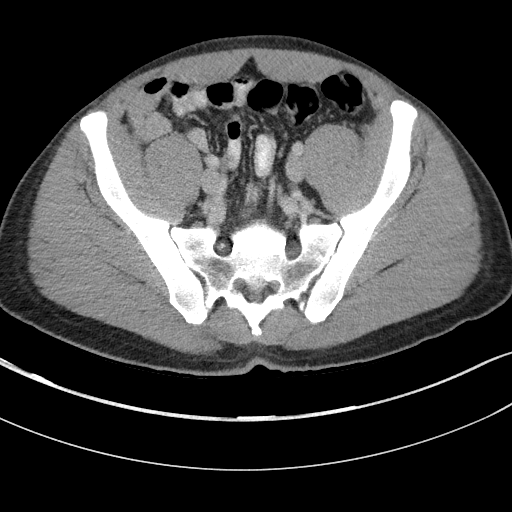
[im 42/98  soft-tissue]
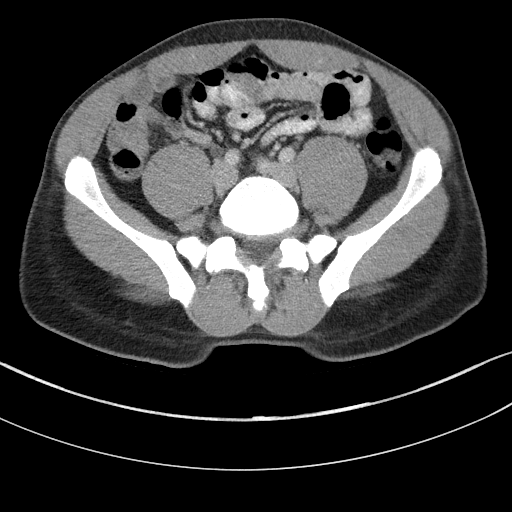
[im 49/98  soft-tissue]
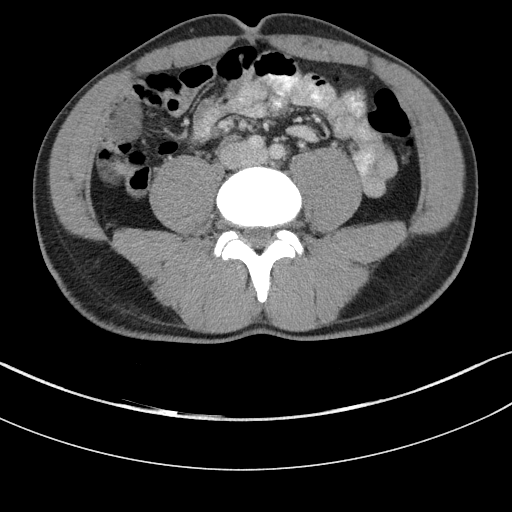
[im 56/98  soft-tissue]
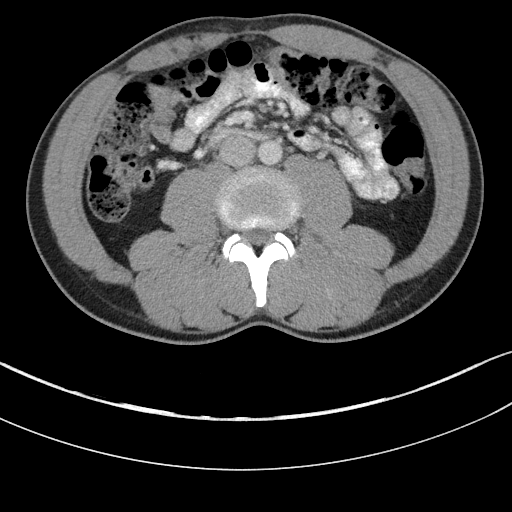
[im 64/98  soft-tissue]
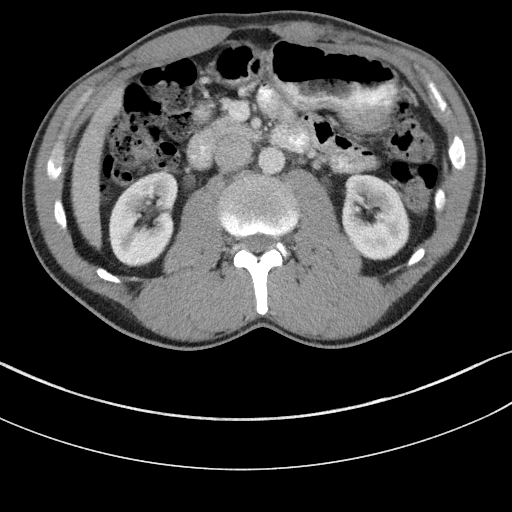
[im 64/98  bone]
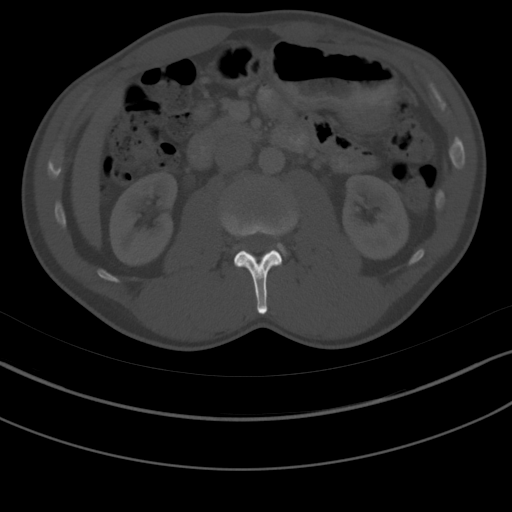
[im 71/98  soft-tissue]
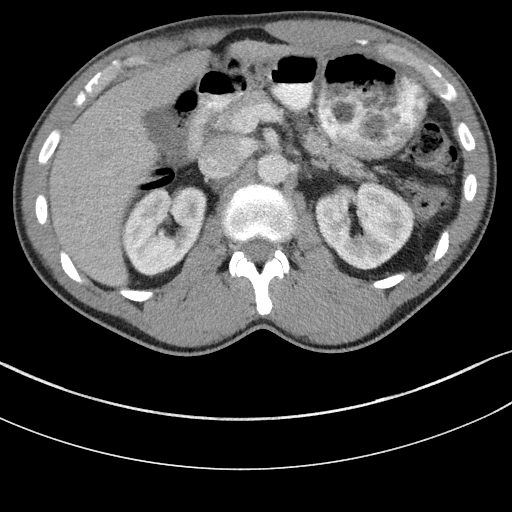
[im 79/98  soft-tissue]
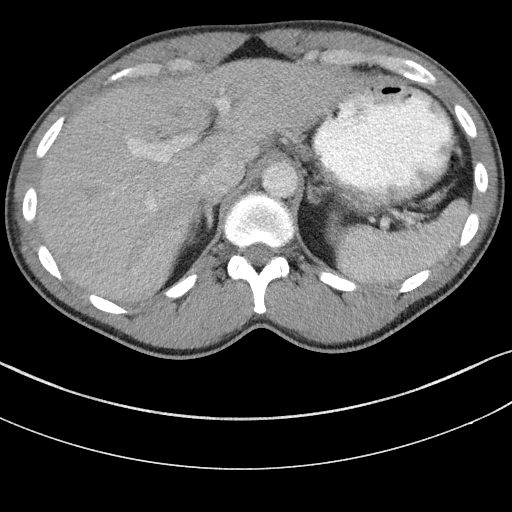
[im 86/98  soft-tissue]
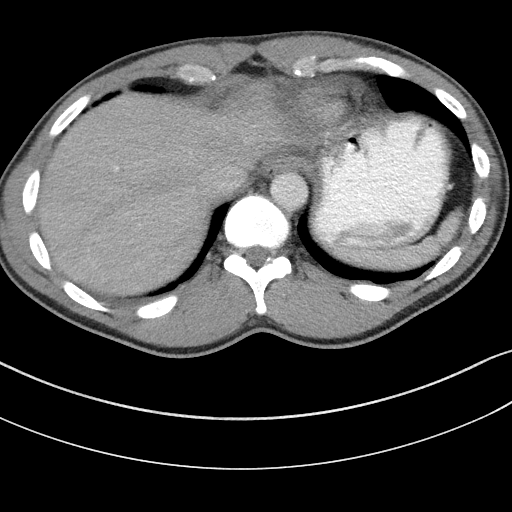
[im 94/98  soft-tissue]
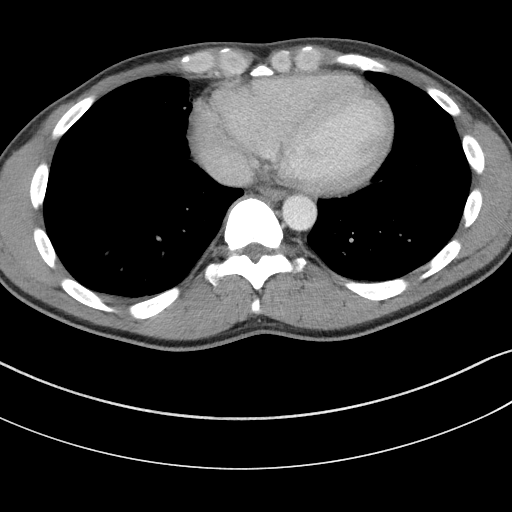

[16 of 46 positions shown; findings below may reference images not displayed]

FINDINGS: Lower chest: Small amount of inferior pericardial fluid, likely
incidental.

Hepatobiliary: Unremarkable

Pancreas: Unremarkable

Spleen: Unremarkable

Adrenals/Urinary Tract: Borderline urinary bladder wall thickening
for the degree of distention.

Stomach/Bowel: Prominent stool throughout the colon favors
constipation. The appendix is not positively identified but I do not
see a definite inflammatory process along the right lower quadrant.

Vascular/Lymphatic: Unremarkable

Reproductive: Unremarkable

Other: There is a 6 mm calcification along the anterior S1 nerve,
image 66/4, with a more punctate 1 mm calcification along the left
S2 nerve, image 62/4.

Musculoskeletal: Degenerative spurring of both femoral heads.
IMPRESSION: 1.  Prominent stool throughout the colon favors constipation.
2. Note is made of a calcification in the right S1 spinal nerve or
nerve sheath near the anterior foramen, smaller calcification on the
left, etiology uncertain. I can't exclude a calcified schwannoma or
remote inflammatory condition with dystrophic calcification. Spinal
nerve calcification can be associated with Charcot-Marie-Tooth
disease. Correlate with any lower extremity neurologic symptoms in
determining the knee for further workup for example by pelvic MRI.

## 2017-12-07 ENCOUNTER — Encounter (HOSPITAL_BASED_OUTPATIENT_CLINIC_OR_DEPARTMENT_OTHER): Payer: Self-pay | Admitting: Emergency Medicine

## 2017-12-07 ENCOUNTER — Emergency Department (HOSPITAL_BASED_OUTPATIENT_CLINIC_OR_DEPARTMENT_OTHER)
Admission: EM | Admit: 2017-12-07 | Discharge: 2017-12-07 | Disposition: A | Discharge status: Home / Self Care | Payer: Self-pay | Attending: Emergency Medicine | Admitting: Emergency Medicine

## 2017-12-07 ENCOUNTER — Other Ambulatory Visit: Payer: Self-pay

## 2017-12-07 DIAGNOSIS — R519 Headache, unspecified: Secondary | ICD-10-CM

## 2017-12-07 DIAGNOSIS — R51 Headache: Secondary | ICD-10-CM

## 2017-12-07 DIAGNOSIS — M549 Dorsalgia, unspecified: Secondary | ICD-10-CM | POA: Insufficient documentation

## 2017-12-07 DIAGNOSIS — Z79899 Other long term (current) drug therapy: Secondary | ICD-10-CM | POA: Insufficient documentation

## 2017-12-07 DIAGNOSIS — F17290 Nicotine dependence, other tobacco product, uncomplicated: Secondary | ICD-10-CM | POA: Insufficient documentation

## 2017-12-07 MED ORDER — IBUPROFEN 600 MG PO TABS: 600.0000 mg | ORAL_TABLET | Freq: Three times a day (TID) | ORAL | 0 refills | Status: DC | PRN

## 2017-12-07 MED ORDER — IBUPROFEN 400 MG PO TABS
600.0000 mg | ORAL_TABLET | Freq: Once | ORAL | Status: AC
  Administered 2017-12-07: 09:00:00 600 mg via ORAL
  Filled 2017-12-07: qty 1

## 2017-12-07 MED ORDER — ACETAMINOPHEN 325 MG PO TABS
650.0000 mg | ORAL_TABLET | Freq: Once | ORAL | Status: AC
  Administered 2017-12-07: 650 mg via ORAL
  Filled 2017-12-07: qty 2

## 2017-12-07 NOTE — ED Triage Notes (Signed)
Reports upper mid back pain from lifting something at work since Thursday.  Additionally c/o headache x 2 days.  Denies nausea, vomiting.

## 2017-12-07 NOTE — ED Provider Notes (Signed)
[facility] EMERGENCY DEPARTMENT Provider Note   CSN: [id number] Arrival date & time: 12/07/17  8295     History   Chief Complaint Chief Complaint  Patient presents with  . Back Pain    HPI Maurice Jensen is a 42 y.o. male.  HPI Patient is a 42 year old male presents the emergency department with left upper back pain worsened with the past 2 days.  He thinks this is secondary to heavy lifting he does at work.  No weakness of his arms or legs.  No fevers or chills.  No midline tenderness.  Also reports mild frontal headache over the past 2 days without changes in his vision.  No head injury.  No nausea or vomiting.  Symptoms are mild in severity.  No other complaints.  He has tried no medications prior to arrival.  History reviewed. No pertinent past medical history.  There are no active problems to display for this patient.   History reviewed. No pertinent surgical history.      Home Medications    Prior to Admission medications   Medication Sig Start Date End Date Taking? Authorizing Provider  HYDROcodone-acetaminophen (NORCO/VICODIN) 5-325 MG tablet Take 1-2 tablets by mouth every 6 (six) hours as needed. 07/06/16   Geoffery Lyons, MD  ibuprofen (ADVIL,MOTRIN) 600 MG tablet Take 1 tablet (600 mg total) by mouth every 8 (eight) hours as needed. 12/07/17   Azalia Bilis, MD  naproxen (NAPROSYN) 500 MG tablet Take 1 tablet (500 mg total) by mouth 2 (two) times daily. 03/27/16   Vanetta Mulders, MD  penicillin v potassium (VEETID) 500 MG tablet Take 1 tablet (500 mg total) by mouth 3 (three) times daily. 07/06/16   Geoffery Lyons, MD    Family History History reviewed. No pertinent family history.  Social History Social History   Tobacco Use  . Smoking status: Current Every Day Smoker    Types: Cigars  . Smokeless tobacco: Never Used  Substance Use Topics  . Alcohol use: No  . Drug use: No     Allergies   Patient has no known allergies.   Review of  Systems Review of Systems  All other systems reviewed and are negative.    Physical Exam Updated Vital Signs BP 124/88 (BP Location: Right Arm)   Pulse 90   Temp 98.4 F (36.9 C) (Oral)   Resp 14   Ht 6\' 5"  (1.956 m)   Wt 90.7 kg   SpO2 100%   BMI 23.72 kg/m   Physical Exam  Constitutional: He is oriented to person, place, and time. He appears well-developed and well-nourished.  HENT:  Head: Normocephalic and atraumatic.  Eyes: EOM are normal.  Neck: Normal range of motion.  Cardiovascular: Normal rate, regular rhythm, normal heart sounds and intact distal pulses.  Pulmonary/Chest: Effort normal and breath sounds normal. No respiratory distress.  Abdominal: Soft. He exhibits no distension. There is no tenderness.  Musculoskeletal:  No thoracic or lumbar midline tenderness.  Neurological: He is alert and oriented to person, place, and time.  Skin: Skin is warm and dry.  Psychiatric: He has a normal mood and affect. Judgment normal.  Nursing note and vitals reviewed.    ED Treatments / Results  Labs (all labs ordered are listed, but only abnormal results are displayed) Labs Reviewed - No data to display  EKG None  Radiology No results found.  Procedures Procedures (including critical care time)  Medications Ordered in ED Medications  ibuprofen (ADVIL,MOTRIN) tablet 600 mg (has  no administration in time range)  acetaminophen (TYLENOL) tablet 650 mg (has no administration in time range)     Initial Impression / Assessment and Plan / ED Course  I have reviewed the triage vital signs and the nursing notes.  Pertinent labs & imaging results that were available during my care of the patient were reviewed by me and considered in my medical decision making (see chart for details).     Muscular skeletal back pain.  No indication for imaging.  Nonspecific headache.  Ambulatory.  Recommended ibuprofen and Tylenol for symptoms  Final Clinical Impressions(s) / ED  Diagnoses   Final diagnoses:  Acute upper back pain  Acute nonintractable headache, unspecified headache type    ED Discharge Orders         Ordered    ibuprofen (ADVIL,MOTRIN) 600 MG tablet  Every 8 hours PRN     12/07/17 0846           Azalia Bilis, MD 12/07/17 [phone]

## 2018-08-24 ENCOUNTER — Emergency Department (HOSPITAL_BASED_OUTPATIENT_CLINIC_OR_DEPARTMENT_OTHER)
Admission: EM | Admit: 2018-08-24 | Discharge: 2018-08-24 | Disposition: A | Discharge status: Home / Self Care | Payer: Self-pay | Attending: Emergency Medicine | Admitting: Emergency Medicine

## 2018-08-24 ENCOUNTER — Encounter (HOSPITAL_BASED_OUTPATIENT_CLINIC_OR_DEPARTMENT_OTHER): Payer: Self-pay | Admitting: Emergency Medicine

## 2018-08-24 ENCOUNTER — Emergency Department (HOSPITAL_BASED_OUTPATIENT_CLINIC_OR_DEPARTMENT_OTHER): Payer: Self-pay

## 2018-08-24 ENCOUNTER — Other Ambulatory Visit: Payer: Self-pay

## 2018-08-24 DIAGNOSIS — Z79899 Other long term (current) drug therapy: Secondary | ICD-10-CM | POA: Insufficient documentation

## 2018-08-24 DIAGNOSIS — F17210 Nicotine dependence, cigarettes, uncomplicated: Secondary | ICD-10-CM | POA: Insufficient documentation

## 2018-08-24 DIAGNOSIS — R319 Hematuria, unspecified: Secondary | ICD-10-CM | POA: Insufficient documentation

## 2018-08-24 LAB — CBC WITH DIFFERENTIAL/PLATELET
Abs Immature Granulocytes: 0.05 10*3/uL (ref 0.00–0.07)
Basophils Absolute: 0 10*3/uL (ref 0.0–0.1)
Basophils Relative: 0 %
Eosinophils Absolute: 0.3 10*3/uL (ref 0.0–0.5)
Eosinophils Relative: 2 %
HCT: 40.8 % (ref 39.0–52.0)
Hemoglobin: 12.9 g/dL — ABNORMAL LOW (ref 13.0–17.0)
Immature Granulocytes: 0 %
Lymphocytes Relative: 32 %
Lymphs Abs: 3.9 10*3/uL (ref 0.7–4.0)
MCH: 29.4 pg (ref 26.0–34.0)
MCHC: 31.6 g/dL (ref 30.0–36.0)
MCV: 92.9 fL (ref 80.0–100.0)
Monocytes Absolute: 0.7 10*3/uL (ref 0.1–1.0)
Monocytes Relative: 6 %
Neutro Abs: 7.3 10*3/uL (ref 1.7–7.7)
Neutrophils Relative %: 60 %
Platelets: 227 10*3/uL (ref 150–400)
RBC: 4.39 MIL/uL (ref 4.22–5.81)
RDW: 12 % (ref 11.5–15.5)
WBC: 12.2 10*3/uL — ABNORMAL HIGH (ref 4.0–10.5)
nRBC: 0 % (ref 0.0–0.2)

## 2018-08-24 LAB — URINALYSIS, ROUTINE W REFLEX MICROSCOPIC
Bilirubin Urine: NEGATIVE
Glucose, UA: NEGATIVE mg/dL
Ketones, ur: NEGATIVE mg/dL
Nitrite: NEGATIVE
Protein, ur: NEGATIVE mg/dL
Specific Gravity, Urine: 1.025 (ref 1.005–1.030)
pH: 6 (ref 5.0–8.0)

## 2018-08-24 LAB — BASIC METABOLIC PANEL
Anion gap: 7 (ref 5–15)
BUN: 12 mg/dL (ref 6–20)
CO2: 28 mmol/L (ref 22–32)
Calcium: 8.7 mg/dL — ABNORMAL LOW (ref 8.9–10.3)
Chloride: 102 mmol/L (ref 98–111)
Creatinine, Ser: 1.08 mg/dL (ref 0.61–1.24)
GFR calc Af Amer: >60 mL/min (ref >60)
GFR calc non Af Amer: >60 mL/min (ref >60)
Glucose, Bld: 92 mg/dL (ref 70–99)
Potassium: 3.4 mmol/L — ABNORMAL LOW (ref 3.5–5.1)
Sodium: 137 mmol/L (ref 135–145)

## 2018-08-24 LAB — URINALYSIS, MICROSCOPIC (REFLEX)

## 2018-08-24 MED ORDER — CEPHALEXIN 500 MG PO CAPS: 500.0000 mg | ORAL_CAPSULE | Freq: Four times a day (QID) | ORAL | 0 refills | Status: DC

## 2018-08-24 NOTE — ED Notes (Signed)
Patient transported to CT 

## 2018-08-24 NOTE — Discharge Instructions (Addendum)
You were seen in the emergency department for blood in your urine.  There was no obvious signs of infection but we still sent off some testing for STDs.  Your kidney function was okay and there was no evidence of a kidney stone.  This may likely resolve on its own but were getting give you the number for the urology clinic if it continues to happen because they can set you up for further testing.

## 2018-08-24 NOTE — ED Provider Notes (Signed)
[facility] EMERGENCY DEPARTMENT Provider Note   CSN: [id number] Arrival date & time: 08/24/18  2054     History   Chief Complaint Chief Complaint  Patient presents with  . Hematuria    HPI Maurice Jensen is a 43 y.o. male.  He has no significant past medical history.  He is complaining of some pink in his urine when he urinated about half hour ago.  This is the first time it happened.  He said it might of been associated with a little bit discomfort but not much.  No back pain no abdominal pain no nausea no vomiting no fever no cough.  No recent trauma.  He is not on any medications.  He denies any STD symptoms including lesions or discharge.     The history is provided by the patient.  Hematuria This is a new problem. The current episode started less than 1 hour ago. The problem has not changed since onset.Pertinent negatives include no chest pain, no abdominal pain, no headaches and no shortness of breath. Nothing aggravates the symptoms. Nothing relieves the symptoms. He has tried nothing for the symptoms. The treatment provided no relief.    History reviewed. No pertinent past medical history.  There are no active problems to display for this patient.   History reviewed. No pertinent surgical history.      Home Medications    Prior to Admission medications   Medication Sig Start Date End Date Taking? Authorizing Provider  HYDROcodone-acetaminophen (NORCO/VICODIN) 5-325 MG tablet Take 1-2 tablets by mouth every 6 (six) hours as needed. 07/06/16   Veryl Speak, MD  ibuprofen (ADVIL,MOTRIN) 600 MG tablet Take 1 tablet (600 mg total) by mouth every 8 (eight) hours as needed. 12/07/17   Jola Schmidt, MD  naproxen (NAPROSYN) 500 MG tablet Take 1 tablet (500 mg total) by mouth 2 (two) times daily. 03/27/16   Fredia Sorrow, MD  penicillin v potassium (VEETID) 500 MG tablet Take 1 tablet (500 mg total) by mouth 3 (three) times daily. 07/06/16   Veryl Speak, MD     Family History No family history on file.  Social History Social History   Tobacco Use  . Smoking status: Current Every Day Smoker    Types: Cigars  . Smokeless tobacco: Never Used  Substance Use Topics  . Alcohol use: No  . Drug use: No     Allergies   Patient has no known allergies.   Review of Systems Review of Systems  Constitutional: Negative for fever.  HENT: Negative for sore throat.   Eyes: Negative for visual disturbance.  Respiratory: Negative for shortness of breath.   Cardiovascular: Negative for chest pain.  Gastrointestinal: Negative for abdominal pain.  Genitourinary: Positive for hematuria. Negative for discharge, dysuria, penile pain and penile swelling.  Musculoskeletal: Negative for back pain.  Skin: Negative for rash.  Neurological: Negative for headaches.     Physical Exam Updated Vital Signs BP 119/83 (BP Location: Right Arm)   Pulse 65   Temp 98.6 F (37 C) (Oral)   Resp 16   Ht 6\' 5"  (1.956 m)   Wt 87.1 kg   SpO2 99%   BMI 22.77 kg/m   Physical Exam Vitals signs and nursing note reviewed.  Constitutional:      Appearance: He is well-developed.  HENT:     Head: Normocephalic and atraumatic.  Eyes:     Conjunctiva/sclera: Conjunctivae normal.  Neck:     Musculoskeletal: Neck supple.  Cardiovascular:  Rate and Rhythm: Normal rate and regular rhythm.     Heart sounds: No murmur.  Pulmonary:     Effort: Pulmonary effort is normal. No respiratory distress.     Breath sounds: Normal breath sounds.  Abdominal:     Palpations: Abdomen is soft.     Tenderness: There is no abdominal tenderness.  Musculoskeletal: Normal range of motion.     Right lower leg: No edema.     Left lower leg: No edema.  Skin:    General: Skin is warm and dry.     Capillary Refill: Capillary refill takes less than 2 seconds.  Neurological:     General: No focal deficit present.     Mental Status: He is alert.     Gait: Gait normal.      ED  Treatments / Results  Labs (all labs ordered are listed, but only abnormal results are displayed) Labs Reviewed  URINALYSIS, ROUTINE W REFLEX MICROSCOPIC - Abnormal; Notable for the following components:      Result Value   APPearance HAZY (*)    Hgb urine dipstick MODERATE (*)    Leukocytes,Ua TRACE (*)    All other components within normal limits  URINALYSIS, MICROSCOPIC (REFLEX) - Abnormal; Notable for the following components:   Bacteria, UA RARE (*)    All other components within normal limits  BASIC METABOLIC PANEL - Abnormal; Notable for the following components:   Potassium 3.4 (*)    Calcium 8.7 (*)    All other components within normal limits  CBC WITH DIFFERENTIAL/PLATELET - Abnormal; Notable for the following components:   WBC 12.2 (*)    Hemoglobin 12.9 (*)    All other components within normal limits  GC/CHLAMYDIA PROBE AMP ([place]) NOT AT [facility]    EKG None  Radiology Ct Renal Stone Study  Result Date: 08/24/2018 CLINICAL DATA:  43 year old male with gross hematuria. EXAM: CT ABDOMEN AND PELVIS WITHOUT CONTRAST TECHNIQUE: Multidetector CT imaging of the abdomen and pelvis was performed following the standard protocol without IV contrast. COMPARISON:  CT Abdomen and Pelvis 07/02/2015. FINDINGS: Lower chest: Mild chronic left lung base scarring is stable. There is mild atelectasis in the medial segment of the right middle lobe. No pericardial or pleural effusion. Hepatobiliary: Negative noncontrast liver and gallbladder. Pancreas: Negative. Spleen: Negative. Adrenals/Urinary Tract: Normal adrenal glands. Negative noncontrast left kidney and ureter. Negative noncontrast right kidney and ureter. No urinary calculus or inflammatory stranding. Unremarkable urinary bladder. Incidental pelvic phleboliths. Stomach/Bowel: Negative descending and rectosigmoid colon. Retained stool in the transverse colon which is mildly redundant. The cecum is partially located in the right upper  quadrant and the appendix is subhepatic and normal on coronal image 49. Negative terminal ileum aside from flocculated contents. No dilated small bowel. Negative stomach. No free air, free fluid. Vascular/Lymphatic: Vascular patency is not evaluated in the absence of IV contrast. No lymphadenopathy is evident. Reproductive: Negative. Other: No pelvic free fluid. Musculoskeletal: No acute osseous abnormality identified. IMPRESSION: Negative noncontrast CT Abdomen and Pelvis. No urinary calculus or explanation for hematuria. Electronically Signed   By: Odessa FlemingH  Hall M.D.   On: 08/24/2018 22:11    Procedures Procedures (including critical care time)  Medications Ordered in ED Medications - No data to display   Initial Impression / Assessment and Plan / ED Course  I have reviewed the triage vital signs and the nursing notes.  Pertinent labs & imaging results that were available during my care of the patient were reviewed  by me and considered in my medical decision making (see chart for details).  Clinical Course as of Aug 25 835  Wed Aug 24, 2018  [id number] Healthy 43 year old male here with a single episode of hematuria.  Differential includes UTI, STD, kidney stone, nephritis   [MB]  2201 Patient had significant hematuria on UA with 21-50 reds.  Sent off labs and he is mild elevation of his white blood cell count of 12.  BMP still pending.  I put him in for a CT renal and I do not see any obvious stones.  Awaiting radiology reading.   [MB]  2206 Patient's renal function normal.   [MB]  2224 Reviewed these results with the patient.  He said he had googled it and was concerned that he might be have infection and would like to get started on an antibiotic just in case.   [MB]    Clinical Course User Index [MB] Terrilee FilesButler, Choua Ikner C, MD        Final Clinical Impressions(s) / ED Diagnoses   Final diagnoses:  Hematuria, unspecified type    ED Discharge Orders         Ordered    cephALEXin (KEFLEX)  500 MG capsule  4 times daily     08/24/18 2225           Terrilee FilesButler, Taura Lamarre C, MD 08/25/18 [id number]

## 2018-08-24 NOTE — ED Triage Notes (Signed)
Pt c/o blood in urine just PTA-NAD-steady gait

## 2018-08-29 LAB — GC/CHLAMYDIA PROBE AMP (~~LOC~~) NOT AT ARMC
Chlamydia: POSITIVE — AB
Neisseria Gonorrhea: NEGATIVE

## 2019-01-12 ENCOUNTER — Other Ambulatory Visit: Payer: Self-pay

## 2019-01-12 ENCOUNTER — Emergency Department (HOSPITAL_BASED_OUTPATIENT_CLINIC_OR_DEPARTMENT_OTHER)
Admission: EM | Admit: 2019-01-12 | Discharge: 2019-01-12 | Disposition: A | Discharge status: Home / Self Care | Payer: Self-pay | Attending: Emergency Medicine | Admitting: Emergency Medicine

## 2019-01-12 ENCOUNTER — Encounter (HOSPITAL_BASED_OUTPATIENT_CLINIC_OR_DEPARTMENT_OTHER): Payer: Self-pay | Admitting: Emergency Medicine

## 2019-01-12 DIAGNOSIS — Z202 Contact with and (suspected) exposure to infections with a predominantly sexual mode of transmission: Secondary | ICD-10-CM | POA: Insufficient documentation

## 2019-01-12 DIAGNOSIS — F17210 Nicotine dependence, cigarettes, uncomplicated: Secondary | ICD-10-CM | POA: Insufficient documentation

## 2019-01-12 LAB — HIV ANTIBODY (ROUTINE TESTING W REFLEX): HIV Screen 4th Generation wRfx: NONREACTIVE

## 2019-01-12 MED ORDER — AZITHROMYCIN 250 MG PO TABS
1000.0000 mg | ORAL_TABLET | Freq: Once | ORAL | Status: AC
  Administered 2019-01-12: 08:00:00 1000 mg via ORAL
  Filled 2019-01-12: qty 4

## 2019-01-12 MED ORDER — LIDOCAINE HCL (PF) 1 % IJ SOLN
INTRAMUSCULAR | Status: AC
  Administered 2019-01-12: 08:00:00 2.3 mL
  Filled 2019-01-12: qty 5

## 2019-01-12 MED ORDER — CEFTRIAXONE SODIUM 250 MG IJ SOLR
250.0000 mg | Freq: Once | INTRAMUSCULAR | Status: AC
  Administered 2019-01-12: 08:00:00 250 mg via INTRAMUSCULAR
  Filled 2019-01-12: qty 250

## 2019-01-12 NOTE — ED Triage Notes (Signed)
Pt states his partner told him that she had chlamydia.  Pt denies any symptoms presently.

## 2019-01-12 NOTE — ED Provider Notes (Signed)
[facility] EMERGENCY DEPARTMENT Provider Note   CSN: [id number] Arrival date & time: 01/12/19  0756     History   Chief Complaint Chief Complaint  Patient presents with  . Exposure to STD    HPI Maurice Jensen is a 43 y.o. male.     Patient is a 43 year old male who presents after an exposure to chlamydia.  He recently "messed around" with a girl and she called him and said that she had chlamydia.  He currently denies any symptoms.  No penile discharge.  No dysuria.  No history of STDs in the past.     History reviewed. No pertinent past medical history.  There are no active problems to display for this patient.   History reviewed. No pertinent surgical history.      Home Medications    Prior to Admission medications   Medication Sig Start Date End Date Taking? Authorizing Provider  cephALEXin (KEFLEX) 500 MG capsule Take 1 capsule (500 mg total) by mouth 4 (four) times daily. 08/24/18   Terrilee Files, MD  HYDROcodone-acetaminophen (NORCO/VICODIN) 5-325 MG tablet Take 1-2 tablets by mouth every 6 (six) hours as needed. 07/06/16   Geoffery Lyons, MD  ibuprofen (ADVIL,MOTRIN) 600 MG tablet Take 1 tablet (600 mg total) by mouth every 8 (eight) hours as needed. 12/07/17   Azalia Bilis, MD  naproxen (NAPROSYN) 500 MG tablet Take 1 tablet (500 mg total) by mouth 2 (two) times daily. 03/27/16   Vanetta Mulders, MD  penicillin v potassium (VEETID) 500 MG tablet Take 1 tablet (500 mg total) by mouth 3 (three) times daily. 07/06/16   Geoffery Lyons, MD    Family History History reviewed. No pertinent family history.  Social History Social History   Tobacco Use  . Smoking status: Current Every Day Smoker    Types: Cigars  . Smokeless tobacco: Never Used  Substance Use Topics  . Alcohol use: Yes    Comment: occ  . Drug use: No     Allergies   Patient has no known allergies.   Review of Systems Review of Systems  Constitutional: Negative for fever.   Gastrointestinal: Negative for abdominal pain and vomiting.  Genitourinary: Negative for difficulty urinating and discharge.  Musculoskeletal: Negative for back pain.  Skin: Negative for wound.     Physical Exam Updated Vital Signs BP 116/80 (BP Location: Right Arm)   Pulse 81   Temp 98.2 F (36.8 C) (Oral)   Resp 14   Ht 6\' 5"  (1.956 m)   Wt 95.3 kg   SpO2 99%   BMI 24.90 kg/m   Physical Exam Constitutional:      Appearance: He is well-developed.  HENT:     Head: Normocephalic and atraumatic.  Neck:     Musculoskeletal: Normal range of motion and neck supple.  Cardiovascular:     Rate and Rhythm: Normal rate.  Pulmonary:     Effort: Pulmonary effort is normal.  Genitourinary:    Comments: Normal-appearing external male genitalia.  Circumcised penis.  No wounds are noted.  No testicular tenderness.  No penile discharge. Skin:    General: Skin is warm and dry.  Neurological:     Mental Status: He is alert and oriented to person, place, and time.      ED Treatments / Results  Labs (all labs ordered are listed, but only abnormal results are displayed) Labs Reviewed  RPR  HIV ANTIBODY (ROUTINE TESTING W REFLEX)  GC/CHLAMYDIA PROBE AMP ([place]) NOT  AT [facility]    EKG None  Radiology No results found.  Procedures Procedures (including critical care time)  Medications Ordered in ED Medications - No data to display   Initial Impression / Assessment and Plan / ED Course  I have reviewed the triage vital signs and the nursing notes.  Pertinent labs & imaging results that were available during my care of the patient were reviewed by me and considered in my medical decision making (see chart for details).        Patient was treated with Rocephin and Zithromax.  He was tested for STDs.  These results are pending.  He was given information about safe sex practices.  Final Clinical Impressions(s) / ED Diagnoses   Final diagnoses:  STD exposure    ED  Discharge Orders    None       Malvin Johns, MD 01/12/19 [phone]

## 2019-01-13 LAB — GC/CHLAMYDIA PROBE AMP (~~LOC~~) NOT AT ARMC
Chlamydia: NEGATIVE
Neisseria Gonorrhea: NEGATIVE

## 2019-01-13 LAB — RPR: RPR Ser Ql: NONREACTIVE

## 2019-05-20 ENCOUNTER — Emergency Department (HOSPITAL_BASED_OUTPATIENT_CLINIC_OR_DEPARTMENT_OTHER)
Admission: EM | Admit: 2019-05-20 | Discharge: 2019-05-20 | Disposition: A | Discharge status: Home / Self Care | Payer: Self-pay | Attending: Emergency Medicine | Admitting: Emergency Medicine

## 2019-05-20 ENCOUNTER — Other Ambulatory Visit: Payer: Self-pay

## 2019-05-20 ENCOUNTER — Encounter (HOSPITAL_BASED_OUTPATIENT_CLINIC_OR_DEPARTMENT_OTHER): Payer: Self-pay | Admitting: Emergency Medicine

## 2019-05-20 DIAGNOSIS — Z7251 High risk heterosexual behavior: Secondary | ICD-10-CM | POA: Insufficient documentation

## 2019-05-20 DIAGNOSIS — F17210 Nicotine dependence, cigarettes, uncomplicated: Secondary | ICD-10-CM | POA: Insufficient documentation

## 2019-05-20 DIAGNOSIS — Z202 Contact with and (suspected) exposure to infections with a predominantly sexual mode of transmission: Secondary | ICD-10-CM | POA: Insufficient documentation

## 2019-05-20 LAB — HIV ANTIBODY (ROUTINE TESTING W REFLEX): HIV Screen 4th Generation wRfx: NONREACTIVE

## 2019-05-20 MED ORDER — METRONIDAZOLE 500 MG PO TABS
2000.0000 mg | ORAL_TABLET | Freq: Once | ORAL | Status: AC
  Administered 2019-05-20: 04:00:00 2000 mg via ORAL
  Filled 2019-05-20: qty 4

## 2019-05-20 MED ORDER — AZITHROMYCIN 1 G PO PACK: 1.0000 g | PACK | Freq: Once | ORAL | Status: DC

## 2019-05-20 NOTE — ED Provider Notes (Signed)
   [facility] Provider Note: Lowella Dell, MD, FACEP  CSN: [id number] MRN: [id number] ARRIVAL: 05/20/19 at 0312 ROOM: [facility]06/[facility]06   CHIEF COMPLAINT  Exposure to STD   HISTORY OF PRESENT ILLNESS  05/20/19 3:26 AM Maurice Jensen is a 44 y.o. male who states he had unprotected sex with a woman 7 days ago and was called yesterday saying she tested positive for chlamydia and trichomoniasis.  Patient denies any symptoms.  Specifically denies dysuria, urethral discharge or abdominal pain    History reviewed. No pertinent past medical history.  History reviewed. No pertinent surgical history.  History reviewed. No pertinent family history.  Social History   Tobacco Use  . Smoking status: Current Every Day Smoker    Types: Cigars  . Smokeless tobacco: Never Used  Substance Use Topics  . Alcohol use: Yes    Comment: occ  . Drug use: No    Prior to Admission medications   Not on File    Allergies Patient has no known allergies.   REVIEW OF SYSTEMS  Negative except as noted here or in the History of Present Illness.   PHYSICAL EXAMINATION  Initial Vital Signs Blood pressure 127/86, pulse 67, temperature 98 F (36.7 C), temperature source Oral, resp. rate 18, height 6\' 5"  (1.956 m), weight 86.2 kg, SpO2 98 %.  Examination General: Well-developed, well-nourished male in no acute distress; appearance consistent with age of record HENT: normocephalic; atraumatic Eyes: Normal appearance Neck: supple Heart: regular rate and rhythm Lungs: clear to auscultation bilaterally Abdomen: soft; nondistended; nontender; bowel sounds present GU: Tanner V male, circumcised; no urethral discharge or meatal edema Extremities: No deformity; full range of motion Neurologic: Awake, alert and oriented; motor function intact in all extremities and symmetric; no facial droop Skin: Warm and dry Psychiatric: Normal mood and affect   RESULTS  Summary of this visit's results,  reviewed and interpreted by myself:   EKG Interpretation  Date/Time:    Ventricular Rate:    PR Interval:    QRS Duration:   QT Interval:    QTC Calculation:   R Axis:     Text Interpretation:        Laboratory Studies: No results found for this or any previous visit (from the past 24 hour(s)). Imaging Studies: No results found.  ED COURSE and MDM  Nursing notes, initial and subsequent vitals signs, including pulse oximetry, reviewed and interpreted by myself.  Vitals:   05/20/19 0324 05/20/19 0325  BP: 127/86   Pulse: 67   Resp: 18   Temp: 98 F (36.7 C)   TempSrc: Oral   SpO2: 98%   Weight:  86.2 kg  Height:  6\' 5"  (1.956 m)   Medications  metroNIDAZOLE (FLAGYL) tablet 2,000 mg (has no administration in time range)   We will go ahead and treat for trichomoniasis as this is often asymptomatic a difficult diagnosis in males.  As patient is asymptomatic cyst we will test him for gonorrhea and chlamydia and treat if positive per current CDC guidelines.  RPR and HIV serology also drawn.   PROCEDURES  Procedures   ED DIAGNOSES     ICD-10-CM   1. Exposure to STD  Z20.2        05/22/19, MD 05/20/19 [phone]

## 2019-05-20 NOTE — ED Triage Notes (Signed)
  Patient comes in with possible STD exposure.  Patient states he had unprotected sex with a woman 7 days ago and was called earlier yesterday saying she tested positive for chlamydia and trichomonas.  Patient states he has had no painful urination, penile discharge, or discomfort.  Patient was concerned and wanted to get checked out despite being asymptomatic.  No pain.

## 2019-05-21 LAB — RPR: RPR Ser Ql: NONREACTIVE

## 2019-05-23 LAB — GC/CHLAMYDIA PROBE AMP (~~LOC~~) NOT AT ARMC
Chlamydia: NEGATIVE
Neisseria Gonorrhea: NEGATIVE

## 2019-08-25 ENCOUNTER — Other Ambulatory Visit: Payer: Self-pay

## 2019-08-25 ENCOUNTER — Encounter (HOSPITAL_BASED_OUTPATIENT_CLINIC_OR_DEPARTMENT_OTHER): Payer: Self-pay | Admitting: *Deleted

## 2019-08-25 ENCOUNTER — Emergency Department (HOSPITAL_BASED_OUTPATIENT_CLINIC_OR_DEPARTMENT_OTHER)
Admission: EM | Admit: 2019-08-25 | Discharge: 2019-08-26 | Disposition: A | Discharge status: Home / Self Care | Payer: Self-pay | Attending: Emergency Medicine | Admitting: Emergency Medicine

## 2019-08-25 DIAGNOSIS — Z202 Contact with and (suspected) exposure to infections with a predominantly sexual mode of transmission: Secondary | ICD-10-CM | POA: Insufficient documentation

## 2019-08-25 DIAGNOSIS — F17290 Nicotine dependence, other tobacco product, uncomplicated: Secondary | ICD-10-CM | POA: Insufficient documentation

## 2019-08-25 NOTE — ED Triage Notes (Signed)
Pt states has been exposed to an STD

## 2019-08-26 NOTE — ED Provider Notes (Signed)
[facility] EMERGENCY DEPARTMENT Provider Note  CSN: [id number] Arrival date & time: 08/25/19 2347  Chief Complaint(s) Exposure to STD  HPI Maurice Jensen is a 44 y.o. male who presents to the emergency department for possible STD exposure.  He reports he had unprotected sex with same partner he has sex with in March.  States it was a one-time thing.  He was called today and told that she tested positive for an STD.  He denies any penile discharge.  States that he is been having weird sensations with urinating, describing it as inability to void completely.  No abdominal pain.  No scrotal pain.  No other physical complaints.  HPI  Past Medical History History reviewed. No pertinent past medical history. There are no problems to display for this patient.  Home Medication(s) Prior to Admission medications   Not on File                                                                                                                                    Past Surgical History History reviewed. No pertinent surgical history. Family History History reviewed. No pertinent family history.  Social History Social History   Tobacco Use  . Smoking status: Current Every Day Smoker    Packs/day: 0.50    Types: Cigars  . Smokeless tobacco: Never Used  Vaping Use  . Vaping Use: Never used  Substance Use Topics  . Alcohol use: Yes    Comment: occ  . Drug use: No   Allergies Patient has no known allergies.  Review of Systems Review of Systems All other systems are reviewed and are negative for acute change except as noted in the HPI  Physical Exam Vital Signs  I have reviewed the triage vital signs BP 129/83   Pulse 73   Temp 98.7 F (37.1 C) (Oral)   Resp 12   Ht 6\' 5"  (1.956 m)   Wt 96.2 kg   SpO2 100%   BMI 25.14 kg/m   Physical Exam Vitals reviewed.  Constitutional:      General: He is not in acute distress.    Appearance: He is well-developed. He is not  diaphoretic.  HENT:     Head: Normocephalic and atraumatic.     Jaw: No trismus.     Right Ear: External ear normal.     Left Ear: External ear normal.     Nose: Nose normal.  Eyes:     General: No scleral icterus.    Conjunctiva/sclera: Conjunctivae normal.  Neck:     Trachea: Phonation normal.  Cardiovascular:     Rate and Rhythm: Normal rate and regular rhythm.  Pulmonary:     Effort: Pulmonary effort is normal. No respiratory distress.     Breath sounds: No stridor.  Abdominal:     General: There is no distension.  Genitourinary:    Penis: Circumcised. No erythema, tenderness, discharge, swelling  or lesions.   Musculoskeletal:        General: Normal range of motion.     Cervical back: Normal range of motion.  Neurological:     Mental Status: He is alert and oriented to person, place, and time.  Psychiatric:        Behavior: Behavior normal.     ED Results and Treatments Labs (all labs ordered are listed, but only abnormal results are displayed) Labs Reviewed  GC/CHLAMYDIA PROBE AMP ([place]) NOT AT [facility]                                                                                                                         EKG  EKG Interpretation  Date/Time:    Ventricular Rate:    PR Interval:    QRS Duration:   QT Interval:    QTC Calculation:   R Axis:     Text Interpretation:        Radiology No results found.  Pertinent labs & imaging results that were available during my care of the patient were reviewed by me and considered in my medical decision making (see chart for details).  Medications Ordered in ED Medications - No data to display                                                                                                                                  Procedures Procedures  (including critical care time)  Medical Decision Making / ED Course I have reviewed the nursing notes for this encounter and the patient's prior records (if  available in EHR or on provided paperwork).   [name] was evaluated in Emergency Department on 08/26/2019 for the symptoms described in the history of present illness. He was evaluated in the context of the global COVID-19 pandemic, which necessitated consideration that the patient might be at risk for infection with the SARS-CoV-2 virus that causes COVID-19. Institutional protocols and algorithms that pertain to the evaluation of patients at risk for COVID-19 are in a state of rapid change based on information released by regulatory bodies including the CDC and federal and state organizations. These policies and algorithms were followed during the patient's care in the ED.  Possible exposure to STD.  Same partner he had back in March.  At that time he tested negative.  Without evidence of STD at this time, will send urine to test  for GC/chlamydia.       Final Clinical Impression(s) / ED Diagnoses Final diagnoses:  Possible exposure to STD   The patient appears reasonably screened and/or stabilized for discharge and I doubt any other medical condition or other [facility] requiring further screening, evaluation, or treatment in the ED at this time prior to discharge. Safe for discharge with strict return precautions.  Disposition: Discharge  Condition: Good  I have discussed the results, Dx and Tx plan with the patient/family who expressed understanding and agree(s) with the plan. Discharge instructions discussed at length. The patient/family was given strict return precautions who verbalized understanding of the instructions. No further questions at time of discharge.    ED Discharge Orders    None       Follow Up: Primary care provider  Call  As needed      This chart was dictated using voice recognition software.  Despite best efforts to proofread,  errors can occur which can change the documentation meaning.   Nira Conn, MD 08/26/19 [phone]

## 2019-08-29 LAB — GC/CHLAMYDIA PROBE AMP (~~LOC~~) NOT AT ARMC
Chlamydia: NEGATIVE
Comment: NEGATIVE
Comment: NORMAL
Neisseria Gonorrhea: NEGATIVE

## 2020-11-24 ENCOUNTER — Emergency Department (HOSPITAL_BASED_OUTPATIENT_CLINIC_OR_DEPARTMENT_OTHER)
Admission: EM | Admit: 2020-11-24 | Discharge: 2020-11-24 | Disposition: A | Discharge status: Home / Self Care | Payer: No Typology Code available for payment source | Attending: Emergency Medicine | Admitting: Emergency Medicine

## 2020-11-24 ENCOUNTER — Encounter (HOSPITAL_BASED_OUTPATIENT_CLINIC_OR_DEPARTMENT_OTHER): Payer: Self-pay | Admitting: Emergency Medicine

## 2020-11-24 ENCOUNTER — Emergency Department (HOSPITAL_BASED_OUTPATIENT_CLINIC_OR_DEPARTMENT_OTHER): Payer: No Typology Code available for payment source

## 2020-11-24 ENCOUNTER — Other Ambulatory Visit: Payer: Self-pay

## 2020-11-24 DIAGNOSIS — F17210 Nicotine dependence, cigarettes, uncomplicated: Secondary | ICD-10-CM | POA: Insufficient documentation

## 2020-11-24 DIAGNOSIS — S60211A Contusion of right wrist, initial encounter: Secondary | ICD-10-CM | POA: Diagnosis not present

## 2020-11-24 DIAGNOSIS — Y92410 Unspecified street and highway as the place of occurrence of the external cause: Secondary | ICD-10-CM | POA: Diagnosis not present

## 2020-11-24 DIAGNOSIS — S6991XA Unspecified injury of right wrist, hand and finger(s), initial encounter: Secondary | ICD-10-CM | POA: Diagnosis present

## 2020-11-24 DIAGNOSIS — S161XXA Strain of muscle, fascia and tendon at neck level, initial encounter: Secondary | ICD-10-CM | POA: Diagnosis not present

## 2020-11-24 MED ORDER — IBUPROFEN 800 MG PO TABS
800.0000 mg | ORAL_TABLET | Freq: Once | ORAL | Status: AC
  Administered 2020-11-24: 800 mg via ORAL
  Filled 2020-11-24: qty 1

## 2020-11-24 MED ORDER — IBUPROFEN 800 MG PO TABS: 800.0000 mg | ORAL_TABLET | Freq: Three times a day (TID) | ORAL | 0 refills | Status: DC | PRN

## 2020-11-24 MED ORDER — METHOCARBAMOL 500 MG PO TABS: 500.0000 mg | ORAL_TABLET | Freq: Two times a day (BID) | ORAL | 0 refills | Status: DC | PRN
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
## 2021-06-27 ENCOUNTER — Encounter (HOSPITAL_BASED_OUTPATIENT_CLINIC_OR_DEPARTMENT_OTHER): Payer: Self-pay | Admitting: Emergency Medicine

## 2021-06-27 ENCOUNTER — Emergency Department (HOSPITAL_BASED_OUTPATIENT_CLINIC_OR_DEPARTMENT_OTHER)
Admission: EM | Admit: 2021-06-27 | Discharge: 2021-06-27 | Disposition: A | Discharge status: Home / Self Care | Payer: Self-pay | Attending: Emergency Medicine | Admitting: Emergency Medicine

## 2021-06-27 ENCOUNTER — Other Ambulatory Visit: Payer: Self-pay

## 2021-06-27 DIAGNOSIS — R3915 Urgency of urination: Secondary | ICD-10-CM | POA: Insufficient documentation

## 2021-06-27 DIAGNOSIS — Z202 Contact with and (suspected) exposure to infections with a predominantly sexual mode of transmission: Secondary | ICD-10-CM | POA: Insufficient documentation

## 2021-06-27 DIAGNOSIS — Z711 Person with feared health complaint in whom no diagnosis is made: Secondary | ICD-10-CM

## 2021-06-27 LAB — URINALYSIS, ROUTINE W REFLEX MICROSCOPIC
Bilirubin Urine: NEGATIVE
Glucose, UA: NEGATIVE mg/dL
Hgb urine dipstick: NEGATIVE
Ketones, ur: NEGATIVE mg/dL
Leukocytes,Ua: NEGATIVE
Nitrite: NEGATIVE
Protein, ur: NEGATIVE mg/dL
Specific Gravity, Urine: >=1.03 (ref 1.005–1.030)
pH: 6 (ref 5.0–8.0)

## 2021-06-27 NOTE — ED Notes (Signed)
Urine sample was collected while the patient was in triage. ?

## 2021-06-27 NOTE — ED Provider Notes (Signed)
?[place] EMERGENCY DEPARTMENT ?Provider Note ? ? ?CSN: [id number] ?Arrival date & time: 06/27/21  2250 ? ?  ? ?History ? ?No chief complaint on file. ? ? ?Maurice Jensen is a 46 y.o. male. ? ?HPI ?Patient is a 46 year old male who presents to the emergency department due to urinary urgency.  Patient states he had unprotected sex with a new male partner 5 days ago.  He states that since then he developed urinary urgency.  Denies any dysuria, frequency, penile discharge, penile pain, scrotal pain, rashes/lesions, fevers, chills. ?  ? ?Home Medications ?Prior to Admission medications   ?Medication Sig Start Date End Date Taking? Authorizing Provider  ?ibuprofen (ADVIL) 800 MG tablet Take 1 tablet (800 mg total) by mouth every 8 (eight) hours as needed. 11/24/20   Hayden Rasmussen, MD  ?methocarbamol (ROBAXIN) 500 MG tablet Take 1 tablet (500 mg total) by mouth 2 (two) times daily as needed for muscle spasms. 11/24/20   Hayden Rasmussen, MD  ?   ? ?Allergies    ?Patient has no known allergies.   ? ?Review of Systems   ?Review of Systems  ?Constitutional:  Negative for fever.  ?Genitourinary:  Positive for urgency. Negative for decreased urine volume, difficulty urinating, dysuria, flank pain, frequency, hematuria, penile discharge, penile pain, penile swelling, scrotal swelling and testicular pain.  ? ?Physical Exam ?Updated Vital Signs ?BP (!) 148/97 (BP Location: Left Arm)   Pulse 85   Temp 97.8 ?F (36.6 ?C)   Resp 16   SpO2 94%  ?Physical Exam ?Vitals and nursing note reviewed.  ?Constitutional:   ?   General: He is not in acute distress. ?   Appearance: He is well-developed.  ?HENT:  ?   Head: Normocephalic and atraumatic.  ?   Right Ear: External ear normal.  ?   Left Ear: External ear normal.  ?Eyes:  ?   General: No scleral icterus.    ?   Right eye: No discharge.     ?   Left eye: No discharge.  ?   Conjunctiva/sclera: Conjunctivae normal.  ?Neck:  ?   Trachea: No tracheal deviation.   ?Cardiovascular:  ?   Rate and Rhythm: Normal rate.  ?Pulmonary:  ?   Effort: Pulmonary effort is normal. No respiratory distress.  ?   Breath sounds: No stridor.  ?Abdominal:  ?   General: There is no distension.  ?Genitourinary: ?   Comments: Patient declined GU exam. ?Musculoskeletal:     ?   General: No swelling or deformity.  ?   Cervical back: Neck supple.  ?Skin: ?   General: Skin is warm and dry.  ?   Findings: No rash.  ?Neurological:  ?   Mental Status: He is alert.  ?   Cranial Nerves: Cranial nerve deficit: no gross deficits.  ? ?ED Results / Procedures / Treatments   ?Labs ?(all labs ordered are listed, but only abnormal results are displayed) ?Labs Reviewed  ?URINALYSIS, ROUTINE W REFLEX MICROSCOPIC  ?GC/CHLAMYDIA PROBE AMP ([place]) NOT AT [facility]  ? ?EKG ?None ? ?Radiology ?No results found. ? ?Procedures ?Procedures  ? ?Medications Ordered in ED ?Medications - No data to display ? ?ED Course/ Medical Decision Making/ A&P ?  ?                        ?Medical Decision Making ?Amount and/or Complexity of Data Reviewed ?Labs: ordered. ? ?Pt is a 46 y.o. male  who presents to the emergency department complaining of urinary urgency.  Reports unprotected sexual intercourse with a new male partner 5 days ago prior to the onset of his symptoms. ? ?Labs: ?UA without abnormalities. ? ?I, Rayna Sexton, PA-C, personally reviewed and evaluated these images and lab results as part of my medical decision-making. ? ?Unsure the source of the patient's symptoms.  He is afebrile, nontachycardic, and nontoxic-appearing.  Besides urinary urgency he denies any somatic complaints at this time.  UA does not appear infectious.  Declined a GU exam.  Will obtain a GC/chlamydia as well.  Patient is going to check these results on MyChart.  Urged him to refrain from sexual intercourse until he receives his results and confirms that they are negative.  If positive he understands to return to the emergency department for  treatment.  Discussed safe sex practices in length. ? ?Patient states that he does not have insurance or a PCP so he was given a referral to [employer] [facility] and wellness.  He appears stable for discharge at this time and he is agreeable.  We discussed return precautions.  His questions were answered and he was amicable at the time of discharge. ? ?Note: Portions of this report may have been transcribed using voice recognition software. Every effort was made to ensure accuracy; however, inadvertent computerized transcription errors may be present.  ? ?Final Clinical Impression(s) / ED Diagnoses ?Final diagnoses:  ?Concern about STD in male without diagnosis  ? ?Rx / DC Orders ?ED Discharge Orders   ? ? None  ? ?  ? ? ?  ?Rayna Sexton, PA-C ?06/27/21 2354 ? ?  ?Truddie Hidden, MD ?06/28/21 [phone] ? ?

## 2021-06-27 NOTE — ED Triage Notes (Signed)
Pt c/o penile discomfort x 2 days. Pt reports having unprotected sex 2 days ago and his penis "hasn't felt right" since. Denies discharge, urinary sx, pain.  ?

## 2021-06-27 NOTE — ED Notes (Signed)
Pt A&OX4 ambulatory at d/c with independent steady gait. Pt verbalized understanding of d/c instructions and follow up care. 

## 2021-06-27 NOTE — Discharge Instructions (Addendum)
Please check the results of your gonorrhea/chlamydia test on MyChart.  They should be available in the next 2 to 3 days.  Please refrain from sexual intercourse until you receive these results.  If either test is positive you will need to return to the emergency department for treatment. ? ?Below is the contact information for [facility] and wellness.  Please give them a call and schedule an appointment for reevaluation. ? ?If you develop any new or worsening symptoms please come back to the emergency department. ?

## 2021-06-30 LAB — GC/CHLAMYDIA PROBE AMP (~~LOC~~) NOT AT ARMC
Chlamydia: NEGATIVE
Comment: NEGATIVE
Comment: NORMAL
Neisseria Gonorrhea: NEGATIVE

## 2021-08-07 ENCOUNTER — Ambulatory Visit: Payer: Self-pay | Attending: Internal Medicine | Admitting: Internal Medicine

## 2021-08-07 ENCOUNTER — Encounter: Payer: Self-pay | Admitting: Internal Medicine

## 2021-08-07 VITALS — BP 132/91 | HR 87 | Temp 97.9°F | Resp 16 | Wt 228.0 lb

## 2021-08-07 DIAGNOSIS — R030 Elevated blood-pressure reading, without diagnosis of hypertension: Secondary | ICD-10-CM

## 2021-08-07 DIAGNOSIS — Z7689 Persons encountering health services in other specified circumstances: Secondary | ICD-10-CM

## 2021-08-07 DIAGNOSIS — H02823 Cysts of right eye, unspecified eyelid: Secondary | ICD-10-CM

## 2021-08-07 DIAGNOSIS — F17200 Nicotine dependence, unspecified, uncomplicated: Secondary | ICD-10-CM

## 2021-08-07 NOTE — Patient Instructions (Addendum)
You can call 1 [phone] quit now to request the nicotine patches and nicotine gum for free. Let me know once you have insurance so that I can refer you to a specialist to have the cyst removed from the right eye. Your blood pressure is elevated.  Try to cut back on salt in the foods.  We will bring you back in 6 weeks to recheck your blood pressure.  Try to cut back to no more than 2 standard drinks a day.  DASH Eating Plan DASH stands for Dietary Approaches to Stop Hypertension. The DASH eating plan is a healthy eating plan that has been shown to: Reduce high blood pressure (hypertension). Reduce your risk for type 2 diabetes, heart disease, and stroke. Help with weight loss. What are tips for following this plan? Reading food labels Check food labels for the amount of salt (sodium) per serving. Choose foods with less than 5 percent of the Daily Value of sodium. Generally, foods with less than 300 milligrams (mg) of sodium per serving fit into this eating plan. To find whole grains, look for the word "whole" as the first word in the ingredient list. Shopping Buy products labeled as "low-sodium" or "no salt added." Buy fresh foods. Avoid canned foods and pre-made or frozen meals. Cooking Avoid adding salt when cooking. Use salt-free seasonings or herbs instead of table salt or sea salt. Check with your health care provider or pharmacist before using salt substitutes. Do not fry foods. Cook foods using healthy methods such as baking, boiling, grilling, roasting, and broiling instead. Cook with heart-healthy oils, such as olive, canola, avocado, soybean, or sunflower oil. Meal planning  Eat a balanced diet that includes: 4 or more servings of fruits and 4 or more servings of vegetables each day. Try to fill one-half of your plate with fruits and vegetables. 6-8 servings of whole grains each day. Less than 6 oz (170 g) of lean meat, poultry, or fish each day. A 3-oz (85-g) serving of meat is about  the same size as a deck of cards. One egg equals 1 oz (28 g). 2-3 servings of low-fat dairy each day. One serving is 1 cup (237 mL). 1 serving of nuts, seeds, or beans 5 times each week. 2-3 servings of heart-healthy fats. Healthy fats called omega-3 fatty acids are found in foods such as walnuts, flaxseeds, fortified milks, and eggs. These fats are also found in cold-water fish, such as sardines, salmon, and mackerel. Limit how much you eat of: Canned or prepackaged foods. Food that is high in trans fat, such as some fried foods. Food that is high in saturated fat, such as fatty meat. Desserts and other sweets, sugary drinks, and other foods with added sugar. Full-fat dairy products. Do not salt foods before eating. Do not eat more than 4 egg yolks a week. Try to eat at least 2 vegetarian meals a week. Eat more home-cooked food and less restaurant, buffet, and fast food. Lifestyle When eating at a restaurant, ask that your food be prepared with less salt or no salt, if possible. If you drink alcohol: Limit how much you use to: 0-1 drink a day for women who are not pregnant. 0-2 drinks a day for men. Be aware of how much alcohol is in your drink. In the [place] one drink equals one 12 oz bottle of beer (355 mL), one 5 oz glass of wine (148 mL), or one 1 oz glass of hard liquor (44 mL). General information Avoid eating  more than 2,300 mg of salt a day. If you have hypertension, you may need to reduce your sodium intake to 1,500 mg a day. Work with your health care provider to maintain a healthy body weight or to lose weight. Ask what an ideal weight is for you. Get at least 30 minutes of exercise that causes your heart to beat faster (aerobic exercise) most days of the week. Activities may include walking, swimming, or biking. Work with your health care provider or dietitian to adjust your eating plan to your individual calorie needs. What foods should I eat? Fruits All fresh, dried, or  frozen fruit. Canned fruit in natural juice (without added sugar). Vegetables Fresh or frozen vegetables (raw, steamed, roasted, or grilled). Low-sodium or reduced-sodium tomato and vegetable juice. Low-sodium or reduced-sodium tomato sauce and tomato paste. Low-sodium or reduced-sodium canned vegetables. Grains Whole-grain or whole-wheat bread. Whole-grain or whole-wheat pasta. Brown rice. Orpah Cobb. Bulgur. Whole-grain and low-sodium cereals. Pita bread. Low-fat, low-sodium crackers. Whole-wheat flour tortillas. Meats and other proteins Skinless chicken or [place]. Ground chicken or [place]. Pork with fat trimmed off. Fish and seafood. Egg whites. Dried beans, peas, or lentils. Unsalted nuts, nut butters, and seeds. Unsalted canned beans. Lean cuts of beef with fat trimmed off. Low-sodium, lean precooked or cured meat, such as sausages or meat loaves. Dairy Low-fat (1%) or fat-free (skim) milk. Reduced-fat, low-fat, or fat-free cheeses. Nonfat, low-sodium ricotta or cottage cheese. Low-fat or nonfat yogurt. Low-fat, low-sodium cheese. Fats and oils Soft margarine without trans fats. Vegetable oil. Reduced-fat, low-fat, or light mayonnaise and salad dressings (reduced-sodium). Canola, safflower, olive, avocado, soybean, and sunflower oils. Avocado. Seasonings and condiments Herbs. Spices. Seasoning mixes without salt. Other foods Unsalted popcorn and pretzels. Fat-free sweets. The items listed above may not be a complete list of foods and beverages you can eat. Contact a dietitian for more information. What foods should I avoid? Fruits Canned fruit in a light or heavy syrup. Fried fruit. Fruit in cream or butter sauce. Vegetables Creamed or fried vegetables. Vegetables in a cheese sauce. Regular canned vegetables (not low-sodium or reduced-sodium). Regular canned tomato sauce and paste (not low-sodium or reduced-sodium). Regular tomato and vegetable juice (not low-sodium or reduced-sodium).  Rosita Fire. Olives. Grains Baked goods made with fat, such as croissants, muffins, or some breads. Dry pasta or rice meal packs. Meats and other proteins Fatty cuts of meat. Ribs. Fried meat. Tomasa Blase. Bologna, salami, and other precooked or cured meats, such as sausages or meat loaves. Fat from the back of a pig (fatback). Bratwurst. Salted nuts and seeds. Canned beans with added salt. Canned or smoked fish. Whole eggs or egg yolks. Chicken or [place] with skin. Dairy Whole or 2% milk, cream, and half-and-half. Whole or full-fat cream cheese. Whole-fat or sweetened yogurt. Full-fat cheese. Nondairy creamers. Whipped toppings. Processed cheese and cheese spreads. Fats and oils Butter. Stick margarine. Lard. Shortening. Ghee. Bacon fat. Tropical oils, such as coconut, palm kernel, or palm oil. Seasonings and condiments Onion salt, garlic salt, seasoned salt, table salt, and sea salt. Worcestershire sauce. Tartar sauce. Barbecue sauce. Teriyaki sauce. Soy sauce, including reduced-sodium. Steak sauce. Canned and packaged gravies. Fish sauce. Oyster sauce. Cocktail sauce. Store-bought horseradish. Ketchup. Mustard. Meat flavorings and tenderizers. Bouillon cubes. Hot sauces. Pre-made or packaged marinades. Pre-made or packaged taco seasonings. Relishes. Regular salad dressings. Other foods Salted popcorn and pretzels. The items listed above may not be a complete list of foods and beverages you should avoid. Contact a dietitian for more information.  Where to find more information National Heart, Lung, and Blood Institute: PopSteam.is American Heart Association: www.heart.org Academy of Nutrition and Dietetics: www.eatright.org National Kidney Foundation: www.kidney.org Summary The DASH eating plan is a healthy eating plan that has been shown to reduce high blood pressure (hypertension). It may also reduce your risk for type 2 diabetes, heart disease, and stroke. When on the DASH eating plan, aim to  eat more fresh fruits and vegetables, whole grains, lean proteins, low-fat dairy, and heart-healthy fats. With the DASH eating plan, you should limit salt (sodium) intake to 2,300 mg a day. If you have hypertension, you may need to reduce your sodium intake to 1,500 mg a day. Work with your health care provider or dietitian to adjust your eating plan to your individual calorie needs. This information is not intended to replace advice given to you by your health care provider. Make sure you discuss any questions you have with your health care provider. Document Revised: 01/13/2019 Document Reviewed: 01/13/2019 Elsevier Patient Education  2023 [employer].

## 2021-08-07 NOTE — Progress Notes (Signed)
Patient ID: Maurice Jensen, male    DOB: 05-21-1975  MRN: [id number]  CC:  new pt visit  Subjective: Maurice Jensen is a 46 y.o. male who presents for new pt visit His concerns today include:   No previous PCP No chronic med conditions or medications.    Concern about lump on RT upper eye lid.  Present times several months. No change in size, no pain or drainage.  Does not create any issues with his vision.  Smoke 6 cigarettes a day for 6-7 yrs.  Started smoking while incarcerated.  He is desperately wanting to quit smoking.  He drinks about 4 shots every other night.  No street drug use.  BP elevated. No hx of elev BP in past.   Current Outpatient Medications on File Prior to Visit  Medication Sig Dispense Refill   ibuprofen (ADVIL) 800 MG tablet Take 1 tablet (800 mg total) by mouth every 8 (eight) hours as needed. (Patient not taking: Reported on 08/07/2021) 21 tablet 0   methocarbamol (ROBAXIN) 500 MG tablet Take 1 tablet (500 mg total) by mouth 2 (two) times daily as needed for muscle spasms. (Patient not taking: Reported on 08/07/2021) 20 tablet 0   No current facility-administered medications on file prior to visit.    No Known Allergies  Social History   Socioeconomic History   Marital status: Single    Spouse name: Not on file   Number of children: 1   Years of education: 12 grade   Highest education level: Not on file  Occupational History   Occupation: Cytogeneticist and fibrics    Comment: [employer] in [place]  Tobacco Use   Smoking status: Every Day    Packs/day: 0.25    Types: Cigars, Cigarettes   Smokeless tobacco: Never  Vaping Use   Vaping Use: Never used  Substance and Sexual Activity   Alcohol use: Yes    Alcohol/week: 16.0 standard drinks of alcohol    Types: 16 Shots of liquor per week   Drug use: Not Currently    Types: Marijuana   Sexual activity: Not on file  Other Topics Concern   Not on file  Social History Narrative    Not on file   Social Determinants of Health   Financial Resource Strain: Not on file  Food Insecurity: Not on file  Transportation Needs: Not on file  Physical Activity: Not on file  Stress: Not on file  Social Connections: Not on file  Intimate Partner Violence: Not on file    Family History  Problem Relation Age of Onset   Hypertension Maternal Uncle     History reviewed. No pertinent surgical history.  ROS: Review of Systems Negative except as stated above  PHYSICAL EXAM: BP (!) 132/91   Pulse 87   Temp 97.9 F (36.6 C) (Oral)   Resp 16   Wt 228 lb (103.4 kg)   SpO2 96%   BMI 27.04 kg/m   Physical Exam   General appearance - alert, well appearing, middle-aged to older African-American male and in no distress Mental status - normal mood, behavior, speech, dress, motor activity, and thought processes Eyes -patient has 0.7 cm to 1 cm firm movable mass on the right upper eyelid  medially.  It is not fluctuant.  No tenderness on palpation. Neck - supple, no significant adenopathy Chest - clear to auscultation, no wheezes, rales or rhonchi, symmetric air entry Heart - normal rate, regular rhythm, normal S1, S2, no murmurs,  rubs, clicks or gallops     Latest Ref Rng & Units 08/24/2018    9:47 PM 07/02/2015    1:55 PM 12/06/2012    2:18 PM  CMP  Glucose 70 - 99 mg/dL 92  92  88   BUN 6 - 20 mg/dL 12  10  10    Creatinine 0.61 - 1.24 mg/dL  1.61  0.96   Sodium 135 - 145 mmol/L 137  139  141   Potassium 3.5 - 5.1 mmol/L 3.4  3.9  3.7   Chloride 98 - 111 mmol/L 102  106  104   CO2 22 - 32 mmol/L 28  31  30    Calcium 8.9 - 10.3 mg/dL 8.7  8.6  9.3   Total Protein 6.5 - 8.1 g/dL  6.4    Total Bilirubin 0.3 - 1.2 mg/dL  0.8    Alkaline Phos 38 - 126 U/L  44    AST 15 - 41 U/L  21    ALT 17 - 63 U/L  23     Lipid Panel  No results found for: "CHOL", "TRIG", "HDL", "CHOLHDL", "VLDL", "LDLCALC", "LDLDIRECT"  CBC    Component Value Date/Time   WBC 12.2 (H)  08/24/2018 2147   RBC 4.39 08/24/2018 2147   HGB 12.9 (L) 08/24/2018 2147   HCT 40.8 08/24/2018 2147   PLT 227 08/24/2018 2147   MCV 92.9 08/24/2018 2147   MCH 29.4 08/24/2018 2147   MCHC 31.6 08/24/2018 2147   RDW 12.0 08/24/2018 2147   LYMPHSABS 3.9 08/24/2018 2147   MONOABS 0.7 08/24/2018 2147   EOSABS 0.3 08/24/2018 2147   BASOSABS 0.0 08/24/2018 2147    ASSESSMENT AND PLAN: 1. Establishing care with new doctor, encounter for   2. Cyst, eyelid, right Advised patient that we can refer him to an ophthalmologist, dermatologist or plastic surgeon to have this removed if it is bothersome to him.  Patient tells me he will be signing up for insurance shortly and will call me back with the information so that we can submit referral.  3. Tobacco dependence Pt is current smoker. Patient advised to quit smoking. Discussed health risks associated with smoking including lung and other types of cancers, chronic lung diseases and CV risks.. Pt ready/not ready to give trail of quitting.   Discussed methods to help quit.  Pt wanting to try: Patient states he will quit cold 10/25/2018.  I told him that if he is not successful with that, he can try using the nicotine patches and gum.  I gave him the information for 1 800 quit now to call. _3_ Minutes spent on counseling. F/U: Reassess progress on next office visit.   4. Elevated blood pressure reading without diagnosis of hypertension Discussed health risks associated with elevated blood pressure.  DASH diet discussed and encouraged.  We will have him follow-up in 6 weeks for repeat blood pressure check.    Patient was given the opportunity to ask questions.  Patient verbalized understanding of the plan and was able to repeat key elements of the plan.   This documentation was completed using 2148.  Any transcriptional errors are unintentional.  No orders of the defined types were placed in this  encounter.    Requested Prescriptions    No prescriptions requested or ordered in this encounter    Return in about 6 weeks (around 09/18/2021) for BP recheck.  Paediatric nurse, MD, FACP

## 2021-08-07 NOTE — Progress Notes (Signed)
Concerns regarding bump on left eye

## 2021-09-18 ENCOUNTER — Ambulatory Visit: Payer: Self-pay | Admitting: Internal Medicine

## 2022-03-01 ENCOUNTER — Other Ambulatory Visit: Payer: Self-pay

## 2022-03-01 ENCOUNTER — Encounter (HOSPITAL_BASED_OUTPATIENT_CLINIC_OR_DEPARTMENT_OTHER): Payer: Self-pay

## 2022-03-01 ENCOUNTER — Emergency Department (HOSPITAL_BASED_OUTPATIENT_CLINIC_OR_DEPARTMENT_OTHER)
Admission: EM | Admit: 2022-03-01 | Discharge: 2022-03-02 | Disposition: A | Discharge status: Home / Self Care | Payer: Self-pay | Attending: Emergency Medicine | Admitting: Emergency Medicine

## 2022-03-01 DIAGNOSIS — Z202 Contact with and (suspected) exposure to infections with a predominantly sexual mode of transmission: Secondary | ICD-10-CM | POA: Insufficient documentation

## 2022-03-01 NOTE — ED Triage Notes (Signed)
Pt reports he was sexually active last week when the condom broke. He states he is here to be tested for STDs "to make sure I don't have anything." He denies any symptoms and is not sure if his partner has any sexually transmitted diseases or not.

## 2022-03-01 NOTE — ED Notes (Signed)
Urine specimen in lab 

## 2022-03-02 LAB — URINALYSIS, ROUTINE W REFLEX MICROSCOPIC
Bilirubin Urine: NEGATIVE
Glucose, UA: NEGATIVE mg/dL
Hgb urine dipstick: NEGATIVE
Ketones, ur: NEGATIVE mg/dL
Leukocytes,Ua: NEGATIVE
Nitrite: NEGATIVE
Protein, ur: NEGATIVE mg/dL
Specific Gravity, Urine: 1.02 (ref 1.005–1.030)
pH: 6 (ref 5.0–8.0)

## 2022-03-02 MED ORDER — AZITHROMYCIN 250 MG PO TABS
1000.0000 mg | ORAL_TABLET | Freq: Once | ORAL | Status: AC
  Administered 2022-03-02: 1000 mg via ORAL
  Filled 2022-03-02: qty 4

## 2022-03-02 MED ORDER — STERILE WATER FOR INJECTION IJ SOLN
INTRAMUSCULAR | Status: AC
  Administered 2022-03-02: 10 mL
  Filled 2022-03-02: qty 10

## 2022-03-02 MED ORDER — CEFTRIAXONE SODIUM 500 MG IJ SOLR
500.0000 mg | Freq: Once | INTRAMUSCULAR | Status: AC
  Administered 2022-03-02: 500 mg via INTRAMUSCULAR
  Filled 2022-03-02: qty 500

## 2022-03-02 NOTE — ED Provider Notes (Signed)
  [facility]PART[facility] Provider Note   CSN: [id number] Arrival date & time: 03/01/22  2259     History  Chief Complaint  Patient presents with   Exposure to STD    Maurice Jensen is a 47 y.o. male.  Patient is a 47 year old male with no significant past medical history.  Patient presenting today with complaints of STD exposure.  He reports having sexual intercourse with a new partner last week.  He states that the condom broke and is concerned he may have an STD.  The partner indicates that she was seen by a doctor and may have some sort of communicable disease, but was unclear about this.  Patient denies any dysuria, or urethral discharge, rash, swelling, or other complaints.  The history is provided by the patient.       Home Medications Prior to Admission medications   Medication Sig Start Date End Date Taking? Authorizing Provider  ibuprofen (ADVIL) 800 MG tablet Take 1 tablet (800 mg total) by mouth every 8 (eight) hours as needed. Patient not taking: Reported on 08/07/2021 11/24/20   Hayden Rasmussen, MD  methocarbamol (ROBAXIN) 500 MG tablet Take 1 tablet (500 mg total) by mouth 2 (two) times daily as needed for muscle spasms. Patient not taking: Reported on 08/07/2021 11/24/20   Hayden Rasmussen, MD      Allergies    Patient has no known allergies.    Review of Systems   Review of Systems  All other systems reviewed and are negative.   Physical Exam Updated Vital Signs BP (!) 138/97 (BP Location: Left Arm)   Pulse 85   Temp 97.6 F (36.4 C) (Oral)   Resp 19   Ht 6\' 5"  (1.956 m)   Wt 99.8 kg   SpO2 97%   BMI 26.09 kg/m  Physical Exam Vitals and nursing note reviewed.  Constitutional:      General: He is not in acute distress.    Appearance: Normal appearance. He is not ill-appearing.  HENT:     Head: Normocephalic and atraumatic.  Pulmonary:     Effort: Pulmonary effort is normal.  Skin:    General: Skin is warm and dry.   Neurological:     Mental Status: He is alert and oriented to person, place, and time.     ED Results / Procedures / Treatments   Labs (all labs ordered are listed, but only abnormal results are displayed) Labs Reviewed - No data to display  EKG None  Radiology No results found.  Procedures Procedures    Medications Ordered in ED Medications  cefTRIAXone (ROCEPHIN) injection 500 mg (has no administration in time range)  azithromycin (ZITHROMAX) tablet 1,000 mg (has no administration in time range)    ED Course/ Medical Decision Making/ A&P  Patient presenting with concerns of possible STD.  He reports sexual activity during which time a condom broke.  This was 1 week ago.  The sexual partner insinuated that she possibly had some sort of infection and patient wanted evaluated for possible STD.  Urinalysis is clear.  Given the circumstances, patient was treated with Rocephin and Zithromax.  GC and Chlamydia cultures are currently pending.  Final Clinical Impression(s) / ED Diagnoses Final diagnoses:  None    Rx / DC Orders ED Discharge Orders     None         Veryl Speak, MD 03/02/22 0121

## 2022-03-02 NOTE — Discharge Instructions (Signed)
We will call you if your cultures indicate you require further treatment or need to take additional action.

## 2022-03-03 LAB — GC/CHLAMYDIA PROBE AMP (~~LOC~~) NOT AT ARMC
Chlamydia: NEGATIVE
Comment: NEGATIVE
Comment: NORMAL
Neisseria Gonorrhea: NEGATIVE

## 2022-05-26 ENCOUNTER — Encounter (HOSPITAL_BASED_OUTPATIENT_CLINIC_OR_DEPARTMENT_OTHER): Payer: Self-pay

## 2022-05-26 ENCOUNTER — Other Ambulatory Visit: Payer: Self-pay

## 2022-05-26 DIAGNOSIS — F17210 Nicotine dependence, cigarettes, uncomplicated: Secondary | ICD-10-CM | POA: Insufficient documentation

## 2022-05-26 DIAGNOSIS — I10 Essential (primary) hypertension: Secondary | ICD-10-CM | POA: Insufficient documentation

## 2022-05-26 DIAGNOSIS — Z202 Contact with and (suspected) exposure to infections with a predominantly sexual mode of transmission: Secondary | ICD-10-CM | POA: Diagnosis not present

## 2022-05-26 LAB — URINALYSIS, ROUTINE W REFLEX MICROSCOPIC
Bilirubin Urine: NEGATIVE
Glucose, UA: NEGATIVE mg/dL
Hgb urine dipstick: NEGATIVE
Ketones, ur: NEGATIVE mg/dL
Leukocytes,Ua: NEGATIVE
Nitrite: NEGATIVE
Specific Gravity, Urine: >=1.03 (ref 1.005–1.030)
pH: 6 (ref 5.0–8.0)

## 2022-05-26 LAB — URINALYSIS, MICROSCOPIC (REFLEX)
RBC / HPF: NONE SEEN RBC/hpf (ref 0–5)
WBC, UA: NONE SEEN WBC/hpf (ref 0–5)

## 2022-05-26 NOTE — ED Triage Notes (Signed)
Pt states he had unprotected sex approx 1.5wks ago, "been feeling funny" since. No specific symptoms, just states he's "been feeling funny."

## 2022-05-27 ENCOUNTER — Emergency Department (HOSPITAL_BASED_OUTPATIENT_CLINIC_OR_DEPARTMENT_OTHER)
Admission: EM | Admit: 2022-05-27 | Discharge: 2022-05-27 | Disposition: A | Discharge status: Home / Self Care | Payer: Self-pay | Attending: Emergency Medicine | Admitting: Emergency Medicine

## 2022-05-27 DIAGNOSIS — Z711 Person with feared health complaint in whom no diagnosis is made: Secondary | ICD-10-CM

## 2022-05-27 LAB — HIV ANTIBODY (ROUTINE TESTING W REFLEX): HIV Screen 4th Generation wRfx: NONREACTIVE

## 2022-05-27 LAB — RPR: RPR Ser Ql: NONREACTIVE

## 2022-05-27 NOTE — ED Provider Notes (Signed)
[facility] Provider Note: Georgena Spurling, MD, FACEP  CSN: [id number] MRN: [id number] ARRIVAL: 05/26/22 at 2248 ROOM: [facility]  STD Exposure   HISTORY OF PRESENT ILLNESS  05/27/22 4:38 AM Maurice Jensen is a 47 y.o. male who had unprotected sex with a woman about 1-1/2 weeks ago.  Since then he has been "feeling funny".  Specifically he feels like his urine is not flowing at this freely as usual.  He also saw a few drops of urine on his pants on one occasion.  He is not having any burning or itching in his urethra with urination.  He does not have any lesions on his penis.  He is not having any discharge from his urethra.   History reviewed. No pertinent past medical history.  History reviewed. No pertinent surgical history.  Family History  Problem Relation Age of Onset   Hypertension Maternal Uncle     Social History   Tobacco Use   Smoking status: Every Day    Packs/day: .25    Types: Cigars, Cigarettes   Smokeless tobacco: Never  Vaping Use   Vaping Use: Never used  Substance Use Topics   Alcohol use: Yes    Alcohol/week: 16.0 standard drinks of alcohol    Types: 16 Shots of liquor per week   Drug use: Not Currently    Types: Marijuana    Prior to Admission medications   Not on File    Allergies Patient has no known allergies.   REVIEW OF SYSTEMS  Negative except as noted here or in the History of Present Illness.   PHYSICAL EXAMINATION  Initial Vital Signs Blood pressure (!) 149/96, pulse 80, temperature 98.4 F (36.9 C), temperature source Oral, resp. rate 18, height 6\' 3"  (1.905 m), weight 99.8 kg, SpO2 99 %.  Examination General: Well-developed, well-nourished male in no acute distress; appearance consistent with age of record HENT: normocephalic; atraumatic Eyes: Normal appearance Neck: supple Heart: regular rate and rhythm Lungs: clear to auscultation bilaterally Abdomen: soft; nondistended; nontender; bowel  sounds present GU: Tanner V male, circumcised; no urethral discharge Extremities: No deformity; full range of motion; pulses normal Neurologic: Awake, alert and oriented; motor function intact in all extremities and symmetric; no facial droop Skin: Warm and dry Psychiatric: Normal mood and affect   RESULTS  Summary of this visit's results, reviewed and interpreted by myself:   EKG Interpretation  Date/Time:    Ventricular Rate:    PR Interval:    QRS Duration:   QT Interval:    QTC Calculation:   R Axis:     Text Interpretation:         Laboratory Studies: Results for orders placed or performed during the hospital encounter of 05/27/22 (from the past 24 hour(s))  Urinalysis, Routine w reflex microscopic -Urine, Clean Catch     Status: Abnormal   Collection Time: 05/26/22 10:57 PM  Result Value Ref Range   Color, Urine YELLOW YELLOW   APPearance CLEAR CLEAR   Specific Gravity, Urine >=1.030 1.005 - 1.030   pH 6.0 5.0 - 8.0   Glucose, UA NEGATIVE NEGATIVE mg/dL   Hgb urine dipstick NEGATIVE NEGATIVE   Bilirubin Urine NEGATIVE NEGATIVE   Ketones, ur NEGATIVE NEGATIVE mg/dL   Protein, ur TRACE (A) NEGATIVE mg/dL   Nitrite NEGATIVE NEGATIVE   Leukocytes,Ua NEGATIVE NEGATIVE  Urinalysis, Microscopic (reflex)     Status: Abnormal   Collection Time: 05/26/22 10:57 PM  Result Value Ref Range  RBC / HPF NONE SEEN 0 - 5 RBC/hpf   WBC, UA NONE SEEN 0 - 5 WBC/hpf   Bacteria, UA RARE (A) NONE SEEN   Squamous Epithelial / HPF 0-5 0 - 5 /HPF   Mucus PRESENT    Imaging Studies: No results found.  ED COURSE and MDM  Nursing notes, initial and subsequent vitals signs, including pulse oximetry, reviewed and interpreted by myself.  Vitals:   05/26/22 2255 05/26/22 2256  BP:  (!) 149/96  Pulse:  80  Resp:  18  Temp:  98.4 F (36.9 C)  TempSrc:  Oral  SpO2:  99%  Weight: 99.8 kg   Height: 6\' 3"  (1.905 m)    Medications - No data to display  The patient is having no  symptoms suggestive of urethritis and his urinalysis is negative for red blood cells or white blood cells, nitrites and leukocytes.  There are rare bacteria which is common for male urinalyses.  Urine has been sent for gonorrhea and Chlamydia testing and blood has been sent for RPR and HIV testing.  I do not believe antibiotic treatment is indicated at this time.  PROCEDURES  Procedures   ED DIAGNOSES     ICD-10-CM   1. Concern about STD in male without diagnosis  Z71.1          Natiya Seelinger, Jenny Reichmann, MD 05/27/22 [phone]

## 2022-05-28 LAB — GC/CHLAMYDIA PROBE AMP (~~LOC~~) NOT AT ARMC
Chlamydia: NEGATIVE
Comment: NEGATIVE
Comment: NORMAL
Neisseria Gonorrhea: NEGATIVE

## 2022-07-07 ENCOUNTER — Emergency Department (HOSPITAL_BASED_OUTPATIENT_CLINIC_OR_DEPARTMENT_OTHER)
Admission: EM | Admit: 2022-07-07 | Discharge: 2022-07-07 | Disposition: A | Discharge status: Home / Self Care | Payer: Self-pay | Attending: Emergency Medicine | Admitting: Emergency Medicine

## 2022-07-07 ENCOUNTER — Other Ambulatory Visit: Payer: Self-pay

## 2022-07-07 DIAGNOSIS — K029 Dental caries, unspecified: Secondary | ICD-10-CM

## 2022-07-07 MED ORDER — AMOXICILLIN-POT CLAVULANATE 875-125 MG PO TABS: 1.0000 | ORAL_TABLET | Freq: Two times a day (BID) | ORAL | 0 refills | Status: AC

## 2022-07-07 MED ORDER — BENZOCAINE 20 % MT AERO
INHALATION_SPRAY | Freq: Once | OROMUCOSAL | Status: DC
  Filled 2022-07-07: qty 57

## 2022-07-07 NOTE — Discharge Instructions (Addendum)
It was a pleasure taking care of you today!   You will be prescribed Augmentin, take as directed and ensure to complete the entire course of the antibiotic. You may take over the counter 600 mg Ibuprofen every 6 hours and alternate with 500 mg Tylenol every 6 hours as directed for no more than 7 days.  Attached is information for the on-call dentist, you may call and set up a follow-up appointment regarding today's ED visit.  Attached you will find a resource guide for dentists in the area, call and set up a follow up appointment. Return to the Emergency Department if you are experiencing trouble breathing, trouble swallowing, decreased fluid intake, or worsening symptoms.

## 2022-07-07 NOTE — ED Notes (Signed)
Reviewed discharge instructions and follow up with pt. Pt aware of antibiotic ordered Pt states understanding

## 2022-07-07 NOTE — ED Provider Notes (Signed)
[facility] Provider Note   CSN: [id number] Arrival date & time: 07/07/22  4098    History  Chief Complaint  Patient presents with   Dental Pain     Maurice Jensen is a 47 y.o. male who presents to the Emergency Department complaining of right upper dental pain onset 2 days.  Patient does not have a dentist at this time however he tried to be evaluated by dentist and they noted that they would not see him due to the abscess that he has.  Has right-sided facial swelling that he noted today.  Patient has not tried any medications at home.  He has tried HurriCaine spray for his symptoms without relief.  Denies sore throat, trouble breathing, fever, drainage.   The history is provided by the patient. No language interpreter was used.       Home Medications Prior to Admission medications   Not on File      Allergies    Patient has no known allergies.    Review of Systems   Review of Systems  Constitutional:  Negative for fever.  HENT:  Positive for dental problem. Negative for trouble swallowing.   All other systems reviewed and are negative.   Physical Exam Updated Vital Signs BP (!) 142/91 (BP Location: Left Arm)   Pulse 74   Temp 97.9 F (36.6 C) (Oral)   Resp 17   Ht 6\' 5"  (1.956 m)   Wt 108 kg   SpO2 100%   BMI 28.22 kg/m  Physical Exam Vitals and nursing note reviewed.  Constitutional:      General: He is not in acute distress.    Appearance: He is not ill-appearing.  HENT:     Head: Normocephalic and atraumatic.     Right Ear: External ear normal.     Left Ear: External ear normal.     Nose: Nose normal.     Mouth/Throat:     Mouth: Mucous membranes are moist. Mucous membranes are not dry.     Dentition: Abnormal dentition. Dental tenderness and dental caries present.     Tongue: Tongue does not deviate from midline.     Pharynx: Oropharynx is clear. Uvula midline. No oropharyngeal exudate or posterior  oropharyngeal erythema.     Tonsils: No tonsillar exudate or tonsillar abscesses.     Comments: Partially edentulous. Multiple dental caries noted throughout. Tenderness to palpation to right upper gum line. Fluctuance noted to the area. No trismus. No retropharyngeal abscess. No peritonsillar abscess noted. Uvula midline.  Patient able to speak in clear complete sentences.  Tolerating p.o. secretions well. Eyes:     Extraocular Movements: Extraocular movements intact.  Cardiovascular:     Rate and Rhythm: Normal rate and regular rhythm.     Pulses: Normal pulses.     Heart sounds: Normal heart sounds.  Pulmonary:     Effort: Pulmonary effort is normal. No respiratory distress.     Breath sounds: Normal breath sounds.  Abdominal:     General: Bowel sounds are normal. There is no distension.     Palpations: Abdomen is soft. There is no mass.     Tenderness: There is no abdominal tenderness.  Musculoskeletal:        General: Normal range of motion.     Cervical back: Neck supple.  Lymphadenopathy:     Head:     Right side of head: No submental, submandibular, tonsillar, preauricular or posterior auricular adenopathy.  Left side of head: No submental, submandibular, tonsillar, preauricular or posterior auricular adenopathy.     Cervical: No cervical adenopathy.  Skin:    General: Skin is warm and dry.     Findings: No rash.  Neurological:     Mental Status: He is alert.  Psychiatric:        Behavior: Behavior normal.     ED Results / Procedures / Treatments   Labs (all labs ordered are listed, but only abnormal results are displayed) Labs Reviewed - No data to display  EKG None  Radiology No results found.  Procedures Procedures    Medications Ordered in ED Medications - No data to display  ED Course/ Medical Decision Making/ A&P Clinical Course as of 07/07/22 1750  Tue Jul 07, 2022  1336 Went to apply HurriCaine to patient's gum, patient declines numbing at  this time and opts for treatment with antibiotics.  Discussed with patient there is likely abscess here in the area and that we typically treat this with drainage and antibiotics.  Patient notes that he does not think he will be able to tolerated even with the numbing and would prefer to be treated with antibiotics and follow-up with his dentist. [SB]    Clinical Course User Index [SB] Charlene Detter A, PA-C                             Medical Decision Making Risk Prescription drug management.   Patient presents to the ED with right upper dental pain onset 2 days.  Patient does not have a dentist, however, was able to set up an appointment.  Has associated facial swelling.  Vital signs pt afebrile.  On exam patient with Partially edentulous. Multiple dental caries noted throughout. Tenderness to palpation to right upper gum line. Fluctuance noted to the area. No trismus. No retropharyngeal abscess. No peritonsillar abscess noted. Uvula midline.  Patient able to speak in clear complete sentences.  Tolerating p.o. secretions well. No cardiovascular or respiratory exam findings.  Differential diagnosis includes pharyngeal abscess, dental abscess, Ludwig's angina.   Disposition: Patient presentation suspicious for dentalgia.  Attempted incision and drainage today in the ED, however, patient doesn't want to have I&D completed today and would like to be treated with antibiotics only. Discussed with patient that there is a likely abscess that would require I&D, patient denies wanting I&D today and opts for antibiotics.  After consideration of the diagnostic results and the patients response to treatment, I feel that the patient would benefit from Discharge home. Will treat with Augmentin prescription. Provided with information for on-call dentist for follow up. Provided resource guide for dentists in the area, patient structured to follow-up with a dentist. Supportive care measures and strict return  precautions discussed with patient at bedside. Pt acknowledges and verbalizes understanding. Pt appears safe for discharge. Follow up as indicated in discharge paperwork.  This chart was dictated using voice recognition software, Dragon. Despite the best efforts of this provider to proofread and correct errors, errors may still occur which can change documentation meaning.   Final Clinical Impression(s) / ED Diagnoses Final diagnoses:  Pain due to dental caries    Rx / DC Orders ED Discharge Orders     None         Karle Desrosier A, PA-C 07/07/22 1754    Horton, Clabe Seal, DO 07/08/22 1610

## 2022-07-07 NOTE — ED Triage Notes (Signed)
Patient presents to ED via POV from home. Here with right upper dental pain. Known "bad teeth". Concern for dental abscess. Edema noted.
